# Patient Record
Sex: Female | Born: 1955
Health system: Southern US, Community
[De-identification: ages and names within clinical notes are randomized; demographics above are authoritative.]

## PROBLEM LIST (undated history)

## (undated) DIAGNOSIS — E559 Vitamin D deficiency, unspecified: Secondary | ICD-10-CM

## (undated) DIAGNOSIS — R140 Abdominal distension (gaseous): Secondary | ICD-10-CM

## (undated) DIAGNOSIS — F32A Depression, unspecified: Secondary | ICD-10-CM

## (undated) DIAGNOSIS — T7840XA Allergy, unspecified, initial encounter: Secondary | ICD-10-CM

## (undated) DIAGNOSIS — J189 Pneumonia, unspecified organism: Secondary | ICD-10-CM

## (undated) DIAGNOSIS — Z9889 Other specified postprocedural states: Secondary | ICD-10-CM

## (undated) DIAGNOSIS — R112 Nausea with vomiting, unspecified: Secondary | ICD-10-CM

## (undated) DIAGNOSIS — F419 Anxiety disorder, unspecified: Secondary | ICD-10-CM

## (undated) DIAGNOSIS — K589 Irritable bowel syndrome without diarrhea: Secondary | ICD-10-CM

## (undated) DIAGNOSIS — Z8711 Personal history of peptic ulcer disease: Secondary | ICD-10-CM

## (undated) DIAGNOSIS — I2510 Atherosclerotic heart disease of native coronary artery without angina pectoris: Secondary | ICD-10-CM

## (undated) DIAGNOSIS — I219 Acute myocardial infarction, unspecified: Secondary | ICD-10-CM

## (undated) DIAGNOSIS — M549 Dorsalgia, unspecified: Secondary | ICD-10-CM

## (undated) DIAGNOSIS — F329 Major depressive disorder, single episode, unspecified: Secondary | ICD-10-CM

## (undated) DIAGNOSIS — R141 Gas pain: Secondary | ICD-10-CM

## (undated) DIAGNOSIS — K219 Gastro-esophageal reflux disease without esophagitis: Secondary | ICD-10-CM

## (undated) DIAGNOSIS — K5790 Diverticulosis of intestine, part unspecified, without perforation or abscess without bleeding: Secondary | ICD-10-CM

## (undated) DIAGNOSIS — U071 COVID-19: Secondary | ICD-10-CM

## (undated) DIAGNOSIS — K5900 Constipation, unspecified: Secondary | ICD-10-CM

## (undated) HISTORY — DX: COVID-19: U07.1

## (undated) HISTORY — DX: Allergy, unspecified, initial encounter: T78.40XA

## (undated) HISTORY — DX: Diverticulosis of intestine, part unspecified, without perforation or abscess without bleeding: K57.90

## (undated) HISTORY — DX: Abdominal distension (gaseous): R14.0

## (undated) HISTORY — DX: Dorsalgia, unspecified: M54.9

## (undated) HISTORY — DX: Irritable bowel syndrome, unspecified: K58.9

## (undated) HISTORY — DX: Acute myocardial infarction, unspecified: I21.9

## (undated) HISTORY — DX: Vitamin D deficiency, unspecified: E55.9

## (undated) HISTORY — DX: Constipation, unspecified: K59.00

## (undated) HISTORY — DX: Personal history of peptic ulcer disease: Z87.11

## (undated) HISTORY — DX: Gas pain: R14.1

---

## 1982-09-07 HISTORY — PX: TUBAL LIGATION: SHX77

## 2009-09-07 DIAGNOSIS — J189 Pneumonia, unspecified organism: Secondary | ICD-10-CM

## 2009-09-07 DIAGNOSIS — I219 Acute myocardial infarction, unspecified: Secondary | ICD-10-CM

## 2009-09-07 HISTORY — DX: Acute myocardial infarction, unspecified: I21.9

## 2009-09-07 HISTORY — DX: Pneumonia, unspecified organism: J18.9

## 2009-09-07 HISTORY — PX: CORONARY ANGIOPLASTY WITH STENT PLACEMENT: SHX49

## 2014-05-21 ENCOUNTER — Ambulatory Visit: Payer: Self-pay | Admitting: Family Medicine

## 2014-05-23 ENCOUNTER — Ambulatory Visit (INDEPENDENT_AMBULATORY_CARE_PROVIDER_SITE_OTHER): Payer: Managed Care, Other (non HMO) | Admitting: Physician Assistant

## 2014-05-23 ENCOUNTER — Encounter: Payer: Self-pay | Admitting: Physician Assistant

## 2014-05-23 VITALS — BP 110/69 | HR 88 | Ht 68.0 in | Wt 248.0 lb

## 2014-05-23 DIAGNOSIS — K219 Gastro-esophageal reflux disease without esophagitis: Secondary | ICD-10-CM

## 2014-05-23 DIAGNOSIS — F32A Depression, unspecified: Secondary | ICD-10-CM | POA: Insufficient documentation

## 2014-05-23 DIAGNOSIS — I2583 Coronary atherosclerosis due to lipid rich plaque: Secondary | ICD-10-CM

## 2014-05-23 DIAGNOSIS — K5792 Diverticulitis of intestine, part unspecified, without perforation or abscess without bleeding: Secondary | ICD-10-CM | POA: Insufficient documentation

## 2014-05-23 DIAGNOSIS — Z8719 Personal history of other diseases of the digestive system: Secondary | ICD-10-CM

## 2014-05-23 DIAGNOSIS — I2510 Atherosclerotic heart disease of native coronary artery without angina pectoris: Secondary | ICD-10-CM

## 2014-05-23 DIAGNOSIS — I219 Acute myocardial infarction, unspecified: Secondary | ICD-10-CM

## 2014-05-23 DIAGNOSIS — K8020 Calculus of gallbladder without cholecystitis without obstruction: Secondary | ICD-10-CM

## 2014-05-23 DIAGNOSIS — R1011 Right upper quadrant pain: Secondary | ICD-10-CM

## 2014-05-23 DIAGNOSIS — F3289 Other specified depressive episodes: Secondary | ICD-10-CM

## 2014-05-23 DIAGNOSIS — F329 Major depressive disorder, single episode, unspecified: Secondary | ICD-10-CM

## 2014-05-23 DIAGNOSIS — I213 ST elevation (STEMI) myocardial infarction of unspecified site: Secondary | ICD-10-CM

## 2014-05-23 MED ORDER — SERTRALINE HCL 100 MG PO TABS: 100.0000 mg | ORAL_TABLET | Freq: Every day | ORAL | Status: DC

## 2014-05-23 MED ORDER — SUCRALFATE 1 G PO TABS: 1.0000 g | ORAL_TABLET | Freq: Three times a day (TID) | ORAL | Status: DC

## 2014-05-23 MED ORDER — DICYCLOMINE HCL 20 MG PO TABS: 20.0000 mg | ORAL_TABLET | Freq: Three times a day (TID) | ORAL | Status: DC

## 2014-05-23 MED ORDER — SIMVASTATIN 20 MG PO TABS: 20.0000 mg | ORAL_TABLET | Freq: Every day | ORAL | Status: DC

## 2014-05-23 MED ORDER — OMEPRAZOLE 10 MG PO CPDR: 30.0000 mg | DELAYED_RELEASE_CAPSULE | ORAL | Status: DC | PRN

## 2014-05-23 NOTE — Patient Instructions (Signed)
Will refer to [place] gastro[facility]ology.

## 2014-05-24 LAB — BASIC METABOLIC PANEL WITH GFR
BUN: 15 mg/dL (ref 6–23)
CO2: 25 meq/L (ref 19–32)
Calcium: 9.5 mg/dL (ref 8.4–10.5)
Chloride: 101 mEq/L (ref 96–112)
Creat: 0.64 mg/dL (ref 0.50–1.10)
GFR, Est African American: >89 mL/min
GFR, Est Non African American: >89 mL/min
Glucose, Bld: 85 mg/dL (ref 70–99)
Potassium: 4.4 mEq/L (ref 3.5–5.3)
Sodium: 137 mEq/L (ref 135–145)

## 2014-05-24 LAB — CBC WITH DIFFERENTIAL/PLATELET
BASOS ABS: 0 10*3/uL (ref 0.0–0.1)
Basophils Relative: 0 % (ref 0–1)
EOS ABS: 0.1 10*3/uL (ref 0.0–0.7)
EOS PCT: 1 % (ref 0–5)
HCT: 41.3 % (ref 36.0–46.0)
Hemoglobin: 13.6 g/dL (ref 12.0–15.0)
LYMPHS PCT: 32 % (ref 12–46)
Lymphs Abs: 4.6 10*3/uL — ABNORMAL HIGH (ref 0.7–4.0)
MCH: 29.1 pg (ref 26.0–34.0)
MCHC: 32.9 g/dL (ref 30.0–36.0)
MCV: 88.2 fL (ref 78.0–100.0)
Monocytes Absolute: 1 10*3/uL (ref 0.1–1.0)
Monocytes Relative: 7 % (ref 3–12)
NEUTROS PCT: 60 % (ref 43–77)
Neutro Abs: 8.6 10*3/uL — ABNORMAL HIGH (ref 1.7–7.7)
Platelets: 245 10*3/uL (ref 150–400)
RBC: 4.68 MIL/uL (ref 3.87–5.11)
RDW: 14.1 % (ref 11.5–15.5)
WBC: 14.4 10*3/uL — ABNORMAL HIGH (ref 4.0–10.5)

## 2014-05-24 LAB — H. PYLORI ANTIBODY, IGG: H PYLORI IGG: 0.65 {ISR}

## 2014-05-25 DIAGNOSIS — K8020 Calculus of gallbladder without cholecystitis without obstruction: Secondary | ICD-10-CM | POA: Insufficient documentation

## 2014-05-25 DIAGNOSIS — Z8719 Personal history of other diseases of the digestive system: Secondary | ICD-10-CM | POA: Insufficient documentation

## 2014-05-25 NOTE — Progress Notes (Signed)
Subjective:    Patient ID: Nina Taylor, female    DOB: 1956/06/29, 58 y.o.   MRN: [id number]  HPI Pt is a 58 yo women who presents to the clinic to establish care.   .. Active Ambulatory Problems    Diagnosis Date Noted  . GERD (gastroesophageal reflux disease) 05/23/2014  . Heart attack 05/23/2014  . CAD (coronary artery disease) 05/23/2014  . Depression 05/23/2014  . Diverticulitis 05/23/2014  . History of diverticulitis 05/25/2014   Resolved Ambulatory Problems    Diagnosis Date Noted  . No Resolved Ambulatory Problems   No Additional Past Medical History   .Marland KitchenHistory reviewed. No pertinent family history. .. History   Social History  . Marital Status: Married    Spouse Name: N/A    Number of Children: N/A  . Years of Education: N/A   Occupational History  . Not on file.   Social History Main Topics  . Smoking status: Current Every Day Smoker  . Smokeless tobacco: Not on file  . Alcohol Use: Yes  . Drug Use: No  . Sexual Activity: Not Currently   Other Topics Concern  . Not on file   Social History Narrative  . No narrative on file    She is also here to follow up after ER visit on 05/15/2014 RUQ pain and bright red blood in stool. Blood in stool has resolved. She has had RUQ pain that she has related to her gallbladder for years. Pain has been off and on. She started some natural supplements once before and helped make pain better. For the last week pain has been much worse and then she noted bright red blood in stool. Denies hard stools or constipation. Her RUQ pain is constant and worse with movement and bending. She is nauseated but no vomiting. She does have GERD and taking prilosec. Certain foods do make burning worse in stomach such as fatty greasy foods. Since her first episode of RUQ pain years ago she has changed her diet to baked foods and limited spicies. Never had colonoscopy. Never been to GI. At ER CT, labs, ultrasound done. Did find a single stone  in gallbladder with no indication of obstruction. She was referred to [facility] specialist but she would like to stay in [place].    Review of Systems  All other systems reviewed and are negative.      Objective:   Physical Exam  Constitutional: She is oriented to person, place, and time. She appears well-developed and well-nourished.  Obese.   HENT:  Head: Normocephalic and atraumatic.  Eyes: Conjunctivae are normal.  Neck: Normal range of motion. Neck supple.  Cardiovascular: Normal rate, regular rhythm and normal heart sounds.   Pulmonary/Chest: Effort normal. She has no wheezes.  Abdominal: Soft. She exhibits no mass.  Positive murphys sign.  Tenderness to palpation over RUQ and down over RLQ.   No pain of LUQ and LLQ.   slighty hypoactive bowel sounds.   Lymphadenopathy:    She has no cervical adenopathy.  Neurological: She is alert and oriented to person, place, and time.  Skin: Skin is dry.  Psychiatric: She has a normal mood and affect. Her behavior is normal.          Assessment & Plan:  RUQ pain/Hx of diverticulitis/GERD- pt has had work up including abdominal u/s and CT of abdomen for acute cholecystitis and diverticulitis and was negative. Could still be malfunctioning gallbladder, perhaps needs HIDA scan. Will refer to GI and general  surgery for evaluation. Symptoms also could be GERD exacerbation or even IBS symptoms. Gave pt carafate for next couple of days to add to meal and daily omemprazole to see if gets any relief. Will order h.pylori. Also sent bentyl for stomach cramping to try. Pt has not had colonoscopy either needs to have one scheduled.   Depression- well controlled refilled for 1 year.   CAD/hyperlipidemia/hx of MI- need to recheck. Refilled for a couple of months. Need CPE and will recheck fasting labs.

## 2014-05-29 ENCOUNTER — Telehealth: Payer: Self-pay | Admitting: *Deleted

## 2014-06-22 ENCOUNTER — Other Ambulatory Visit (INDEPENDENT_AMBULATORY_CARE_PROVIDER_SITE_OTHER): Payer: Self-pay | Admitting: Surgery

## 2014-06-25 ENCOUNTER — Encounter: Payer: Self-pay | Admitting: Physician Assistant

## 2014-08-07 ENCOUNTER — Ambulatory Visit (HOSPITAL_COMMUNITY)
Admission: RE | Admit: 2014-08-07 | Discharge: 2014-08-07 | Disposition: A | Discharge status: Home / Self Care | Payer: Managed Care, Other (non HMO) | Source: Ambulatory Visit | Attending: Anesthesiology | Admitting: Anesthesiology

## 2014-08-07 ENCOUNTER — Encounter (HOSPITAL_COMMUNITY)
Admission: RE | Admit: 2014-08-07 | Discharge: 2014-08-07 | Disposition: A | Discharge status: Home / Self Care | Payer: Managed Care, Other (non HMO) | Source: Ambulatory Visit | Attending: Surgery | Admitting: Surgery

## 2014-08-07 ENCOUNTER — Encounter (HOSPITAL_COMMUNITY): Payer: Self-pay

## 2014-08-07 DIAGNOSIS — Z01818 Encounter for other preprocedural examination: Secondary | ICD-10-CM | POA: Diagnosis not present

## 2014-08-07 DIAGNOSIS — K8020 Calculus of gallbladder without cholecystitis without obstruction: Secondary | ICD-10-CM | POA: Insufficient documentation

## 2014-08-07 HISTORY — DX: Atherosclerotic heart disease of native coronary artery without angina pectoris: I25.10

## 2014-08-07 HISTORY — DX: Pneumonia, unspecified organism: J18.9

## 2014-08-07 HISTORY — DX: Nausea with vomiting, unspecified: R11.2

## 2014-08-07 HISTORY — DX: Depression, unspecified: F32.A

## 2014-08-07 HISTORY — DX: Anxiety disorder, unspecified: F41.9

## 2014-08-07 HISTORY — DX: Major depressive disorder, single episode, unspecified: F32.9

## 2014-08-07 HISTORY — DX: Gastro-esophageal reflux disease without esophagitis: K21.9

## 2014-08-07 HISTORY — DX: Other specified postprocedural states: Z98.890

## 2014-08-07 LAB — CBC
HEMATOCRIT: 45.1 % (ref 36.0–46.0)
Hemoglobin: 14.2 g/dL (ref 12.0–15.0)
MCH: 28.7 pg (ref 26.0–34.0)
MCHC: 31.5 g/dL (ref 30.0–36.0)
MCV: 91.1 fL (ref 78.0–100.0)
Platelets: 230 10*3/uL (ref 150–400)
RBC: 4.95 MIL/uL (ref 3.87–5.11)
RDW: 14.7 % (ref 11.5–15.5)
WBC: 13.1 10*3/uL — ABNORMAL HIGH (ref 4.0–10.5)

## 2014-08-07 LAB — BASIC METABOLIC PANEL
Anion gap: 14 (ref 5–15)
BUN: 10 mg/dL (ref 6–23)
CHLORIDE: 101 meq/L (ref 96–112)
CO2: 23 meq/L (ref 19–32)
CREATININE: 0.51 mg/dL (ref 0.50–1.10)
Calcium: 9.3 mg/dL (ref 8.4–10.5)
GFR calc Af Amer: >90 mL/min (ref >90)
GFR calc non Af Amer: >90 mL/min (ref >90)
Glucose, Bld: 105 mg/dL — ABNORMAL HIGH (ref 70–99)
POTASSIUM: 4.9 meq/L (ref 3.7–5.3)
Sodium: 138 mEq/L (ref 137–147)

## 2014-08-07 NOTE — Pre-Procedure Instructions (Signed)
Nina Taylor  08/07/2014   Your procedure is scheduled on:  Wednesday December 9 ,2015 at 0830 AM  Report to [place] at 0630 AM.  Call this number if you have problems the morning of surgery: [phone]   Remember:   Do not eat food or drink liquids after midnight.   Take these medicines the morning of surgery with A SIP OF WATER: Prilosec and Zoloft Stop Aspirin ,Nsaids, and herbal medications 5 days prior to surgery.    Do not wear jewelry, make-up or nail polish.  Do not wear lotions, powders, or perfumes. You may wear deodorant.  Do not shave 48 hours prior to surgery.   Do not bring valuables to the hospital.  [facility] is not responsible                  for any belongings or valuables.               Contacts, dentures or bridgework may not be worn into surgery.  Leave suitcase in the car. After surgery it may be brought to your room.  For patients admitted to the hospital, discharge time is determined by your                treatment team.               Patients discharged the day of surgery will not be allowed to drive  home.    Special Instructions: [facility] - Preparing for Surgery  Before surgery, you can play an important role.  Because skin is not sterile, your skin needs to be as free of germs as possible.  You can reduce the number of germs on you skin by washing with CHG (chlorahexidine gluconate) soap before surgery.  CHG is an antiseptic cleaner which kills germs and bonds with the skin to continue killing germs even after washing.  Please DO NOT use if you have an allergy to CHG or antibacterial soaps.  If your skin becomes reddened/irritated stop using the CHG and inform your nurse when you arrive at Short Stay.  Do not shave (including legs and underarms) for at least 48 hours prior to the first CHG shower.  You may shave your face.  Please follow these instructions carefully:   1.  Shower with CHG Soap the night before surgery  and the                                morning of Surgery.  2.  If you choose to wash your hair, wash your hair first as usual with your       normal shampoo.  3.  After you shampoo, rinse your hair and body thoroughly to remove the                      Shampoo.  4.  Use CHG as you would any other liquid soap.  You can apply chg directly       to the skin and wash gently with scrungie or a clean washcloth.  5.  Apply the CHG Soap to your body ONLY FROM THE NECK DOWN.        Do not use on open wounds or open sores.  Avoid contact with your eyes,       ears, mouth and genitals (private parts).  Wash genitals (private parts)  with your normal soap.  6.  Wash thoroughly, paying special attention to the area where your surgery        will be performed.  7.  Thoroughly rinse your body with warm water from the neck down.  8.  DO NOT shower/wash with your normal soap after using and rinsing off       the CHG Soap.  9.  Pat yourself dry with a clean towel.            10.  Wear clean pajamas.            11.  Place clean sheets on your bed the night of your first shower and do not        sleep with pets.  Day of Surgery  Do not apply any lotions/deoderants the morning of surgery.  Please wear clean clothes to the hospital/surgery center.      Please read over the following fact sheets that you were given: Pain Booklet, Coughing and Deep Breathing and Surgical Site Infection Prevention

## 2014-08-09 NOTE — Progress Notes (Addendum)
Anesthesia Chart Review:  Pt is 58 year old female scheduled for laparoscopic cholecystectomy with intraoperative cholangiogram on 08/15/2014 with Dr. Ninfa Linden.   PMH: CAD (MI 2011, s/p cardiac cath), GERD, anxiety, depression. Current smoker. BMI 38  Medications include: ASA, simvastatin, prilosec, zoloft.   Preoperative labs reviewed.  WBC 13.1  EKG: NSR.   Cardiac cath 07/18/2010: R dominant. Estimated EF 65%.  Mid CX tubular 95% and discrete 75%; stent to 0%.  S/p aspiration thrombectomy mid CX  Spoke with pt by telephone. Last cardiology visit was a year ago with [facility]. Was told did not need to return to cardiology for care unless had future needs. Pt reports walking dogs for 30 minutes twice daily, easily walking up stairs in her home and walking a lot for her job in [employer].   Attempting to get last cardiology note.   Willeen Cass, FNP-BC [facility] Short Stay Surgical Center/Anesthesiology Phone: [phone] 08/09/2014 5:11 PM  Addendum:   Unable to get records from pt's cardiologist Dr. Modesta Messing in [place]. Discussed with Dr. Marcie Bal. Pt will need cardiac clearance prior to surgery. Notified triage nurse at Dr. Trevor Mace office.   Willeen Cass, FNP-BC [facility] Short Stay Surgical Center/Anesthesiology Phone: [phone] 08/10/2014 1:45 PM  Addendum: Patient was seen by cardiologist Dr. Bronson Ing on 08/13/14.  His note states, "I will obtain an echocardiogram on 08/14/14 to obtain a baseline assessment of LV systolic function and regional wall motion. She has no murmurs to suggest valvular pathology. She appears to be at a low risk for a major adverse perioperative cardiac event. I strongly encouraged tobacco cessation.  Echo was done today (08/14/14) and showed: - Left ventricle: The cavity size was normal. Wall thickness was increased in a pattern of moderate LVH. Systolic function was normal. The estimated ejection  fraction was in the range of 60% to 65%. Wall motion was normal; there were no regional wall motion abnormalities. Left ventricular diastolic function parameters were normal. - Aortic valve: Mildly calcified annulus. Mildly thickened leaflets. - Technically adequate study.  Unless otherwise stated, I anticipate that she can proceed as planned since Dr. Court Joy note states he felt she was low risk for surgery but wanted to confirm that she had normal LVEF and wall motion.  Echo today confirmed this. There was also no significant valvular abnormalities.   George Hugh [facility] Short Stay Center/Anesthesiology Phone [phone] 08/14/2014 3:44 PM

## 2014-08-10 ENCOUNTER — Telehealth (INDEPENDENT_AMBULATORY_CARE_PROVIDER_SITE_OTHER): Payer: Self-pay

## 2014-08-10 NOTE — Telephone Encounter (Signed)
Received call from Levada Dy at [facility] short stay. Pt scheduled for GB surgery 08-15-14 but will need cardiac clearance. I advised her this request will be sent to Dr Ninfa Linden and Corene Cornea. Will also send msg in [employer].

## 2014-08-13 ENCOUNTER — Encounter: Payer: Self-pay | Admitting: Cardiovascular Disease

## 2014-08-13 ENCOUNTER — Ambulatory Visit (INDEPENDENT_AMBULATORY_CARE_PROVIDER_SITE_OTHER): Payer: Managed Care, Other (non HMO) | Admitting: Cardiovascular Disease

## 2014-08-13 ENCOUNTER — Ambulatory Visit: Payer: Managed Care, Other (non HMO) | Admitting: Cardiology

## 2014-08-13 ENCOUNTER — Encounter: Payer: Self-pay | Admitting: *Deleted

## 2014-08-13 VITALS — BP 101/66 | HR 95 | Ht 68.0 in | Wt 250.0 lb

## 2014-08-13 DIAGNOSIS — I2510 Atherosclerotic heart disease of native coronary artery without angina pectoris: Secondary | ICD-10-CM

## 2014-08-13 DIAGNOSIS — I252 Old myocardial infarction: Secondary | ICD-10-CM

## 2014-08-13 DIAGNOSIS — Z955 Presence of coronary angioplasty implant and graft: Secondary | ICD-10-CM

## 2014-08-13 DIAGNOSIS — Z01818 Encounter for other preprocedural examination: Secondary | ICD-10-CM

## 2014-08-13 DIAGNOSIS — Z716 Tobacco abuse counseling: Secondary | ICD-10-CM

## 2014-08-13 NOTE — Progress Notes (Addendum)
Patient ID: Nina Taylor, female   DOB: October 01, 1955, 58 y.o.   MRN: [id number]       CARDIOLOGY CONSULT NOTE  Patient ID: Nina Taylor MRN: [id number] DOB/AGE: 1956/04/30 58 y.o.  Admit date: (Not on file) Primary Physician Iran Planas, PA-C  Reason for Consultation: preoperative risk stratification, CAD with h/o MI and stent  HPI: The patient is a 58 year old female scheduled for laparoscopic cholecystectomy with intraoperative cholangiogram on 08/15/2014 with Dr. Ninfa Linden for symptomatic choledocholithiasis.  She reporteldy has a h/o MI and left circumflex coronary artery stent placement in 07/2010, as per a prior office visit. I have no records available at the present time regarding specific past cardiovascular testing. ECG on 08/07/14 showed normal sinus rhythm with no ischemic ST-T abnormalities. CBC showed WBC 13.1. BMET was normal. She underwent stenting in [place] with a cardiologist from [facility]. She participated in cardiac rehab for 3 months and was on dual antiplatelet therapy for 1 year. She last saw her cardiologist there approximately 3 years ago. She denies chest pain, palpitations, shortness of breath, leg swelling, orthopnea, and paroxysmal nocturnal dyspnea. She had been exercising daily at the [facility] in [place] prior to experiencing nausea and abdominal pain from gallstones. She smokes approximately 1 pack of cigarettes per week, and had smoked 1 ppd. She had quit for 6 months in the past and then restarted.  Soc: Married. Son in [place]. Originally from [place], moved to [place] as a child. Moved to [place] in past decade and recently moved to [place] from [place]. 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   Allergies  Allergen Reactions  . Vicodin [Hydrocodone-Acetaminophen] Nausea And Vomiting    Current Outpatient Prescriptions  Medication Sig Dispense Refill  . aspirin 81 MG EC tablet Take 81 mg by mouth.    .  cyclobenzaprine (FLEXERIL) 10 MG tablet Take 10 mg by mouth every 8 (eight) hours as needed for muscle spasms.     Marland Kitchen dicyclomine (BENTYL) 20 MG tablet Take 1 tablet (20 mg total) by mouth 3 (three) times daily before meals. (Patient not taking: Reported on 08/06/2014) 90 tablet 0  . diphenhydrAMINE (BENADRYL) 25 MG tablet Take 25-50 mg by mouth every 6 (six) hours as needed for sleep.    Marland Kitchen omeprazole (PRILOSEC) 10 MG capsule Take 3 capsules (30 mg total) by mouth as needed. (Patient taking differently: Take 30 mg by mouth as needed (reflux). ) 270 capsule 4  . sertraline (ZOLOFT) 100 MG tablet Take 1 tablet (100 mg total) by mouth daily. 90 tablet 4  . simvastatin (ZOCOR) 20 MG tablet Take 1 tablet (20 mg total) by mouth daily. 90 tablet 0  . sucralfate (CARAFATE) 1 G tablet Take 1 tablet (1 g total) by mouth 4 (four) times daily -  with meals and at bedtime. (Patient not taking: Reported on 08/06/2014) 90 tablet 0   No current facility-administered medications for this visit.    Past Medical History  Diagnosis Date  . PONV (postoperative nausea and vomiting)   . Heart attack 2011    in [place], [place] with [facility]  . Coronary artery disease   . Pneumonia 2011  . Depression   . Anxiety   . GERD (gastroesophageal reflux disease)     Past Surgical History  Procedure Laterality Date  . Coronary angioplasty with stent placement  2011  . Tubal ligation  1984    History   Social History  . Marital Status: Married  Spouse Name: N/A    Number of Children: N/A  . Years of Education: N/A   Occupational History  . Not on file.   Social History Main Topics  . Smoking status: Current Every Day Smoker -- 1.00 packs/day for 20 years    Types: Cigarettes  . Smokeless tobacco: Never Used  . Alcohol Use: Yes     Comment: wine rarely  . Drug Use: No  . Sexual Activity: Not Currently   Other Topics Concern  . Not on file   Social History Narrative     No family history of  premature CAD in 1st degree relatives.  Prior to Admission medications   Medication Sig Start Date End Date Taking? Authorizing Provider  aspirin 81 MG EC tablet Take 81 mg by mouth.    Historical Provider, MD  cyclobenzaprine (FLEXERIL) 10 MG tablet Take 10 mg by mouth every 8 (eight) hours as needed for muscle spasms.  02/19/14 02/19/15  Historical Provider, MD  dicyclomine (BENTYL) 20 MG tablet Take 1 tablet (20 mg total) by mouth 3 (three) times daily before meals. Patient not taking: Reported on 08/06/2014 05/23/14   Donella Stade, PA-C  diphenhydrAMINE (BENADRYL) 25 MG tablet Take 25-50 mg by mouth every 6 (six) hours as needed for sleep.    Historical Provider, MD  omeprazole (PRILOSEC) 10 MG capsule Take 3 capsules (30 mg total) by mouth as needed. Patient taking differently: Take 30 mg by mouth as needed (reflux).  05/23/14   Jade L Breeback, PA-C  sertraline (ZOLOFT) 100 MG tablet Take 1 tablet (100 mg total) by mouth daily. 05/23/14   Jade L Breeback, PA-C  simvastatin (ZOCOR) 20 MG tablet Take 1 tablet (20 mg total) by mouth daily. 05/23/14   Jade L Breeback, PA-C  sucralfate (CARAFATE) 1 G tablet Take 1 tablet (1 g total) by mouth 4 (four) times daily -  with meals and at bedtime. Patient not taking: Reported on 08/06/2014 05/23/14   Donella Stade, PA-C     Review of systems complete and found to be negative unless listed above in HPI     Physical exam  BP 101/66  Pulse 95  SpO2 98%   General: NAD Neck: No JVD, no thyromegaly or thyroid nodule.  Lungs: Clear to auscultation bilaterally with normal respiratory effort. CV: Nondisplaced PMI. Regular rate and rhythm, normal S1/S2, no S3/S4, no murmur.  No peripheral edema.  No carotid bruit.  Normal pedal pulses.  Abdomen: Soft, obese, no distention.  Skin: Intact without lesions or rashes.  Neurologic: Alert and oriented x 3.  Psych: Normal affect. Extremities: No clubbing or cyanosis.  HEENT: Normal.   ECG: Most recent  ECG reviewed.  Labs:   Lab Results  Component Value Date   WBC 13.1* 08/07/2014   HGB 14.2 08/07/2014   HCT 45.1 08/07/2014   MCV 91.1 08/07/2014   PLT 230 08/07/2014    Recent Labs Lab 08/07/14 1050  NA 138  K 4.9  CL 101  CO2 23  BUN 10  CREATININE 0.51  CALCIUM 9.3  GLUCOSE 105*   No results found for: CKTOTAL, CKMB, CKMBINDEX, TROPONINI No results found for: CHOL No results found for: HDL No results found for: LDLCALC No results found for: TRIG No results found for: CHOLHDL No results found for: LDLDIRECT       Studies: No results found.  ASSESSMENT AND PLAN:  1. Preoperative risk stratification: She denies exertional symptoms. ECG is normal. She had been  on ASA prior to surgical planning. I would recommend she begin this as soon as feasible in the post-operative period. Continue statin therapy. I will obtain an echocardiogram on 08/14/14 to obtain a baseline assessment of LV systolic function and regional wall motion. She has no murmurs to suggest valvular pathology. She appears to be at a low risk for a major adverse perioperative cardiac event. I strongly encouraged tobacco cessation. 2. CAD with h/o NSTEMI and two LCx stents: Symptomatically stable. She had been on ASA prior to surgical planning. I would recommend she begin this as soon as feasible in the post-operative period. Continue statin therapy. I will obtain an echocardiogram on 08/14/14 to obtain a baseline assessment of LV systolic function and regional wall motion. She has no murmurs to suggest valvular pathology. Would consider beta blockade in the future given her history of MI. Will try and obtain records from [facility]. 3. Tobacco abuse: Cessation counseling provided.  Dispo: f/u 6 months.  Signed: Kate Sable, M.D., F.A.C.C.  08/13/2014, 3:42 PM

## 2014-08-13 NOTE — Patient Instructions (Signed)
Your physician has requested that you have an echocardiogram. Echocardiography is a painless test that uses sound waves to create images of your heart. It provides your doctor with information about the size and shape of your heart and how well your heart's chambers and valves are working. This procedure takes approximately one hour. There are no restrictions for this procedure. Office will contact with results via phone or letter.   Resume Aspirin after surgery & surgeon feels you safely can do so. Continue all other medications.   Your physician wants you to follow up in: 6 months.  You will receive a reminder letter in the mail one-two months in advance.  If you don't receive a letter, please call our office to schedule the follow up appointment

## 2014-08-14 ENCOUNTER — Ambulatory Visit (HOSPITAL_COMMUNITY)
Admission: RE | Admit: 2014-08-14 | Discharge: 2014-08-14 | Disposition: A | Discharge status: Home / Self Care | Payer: Managed Care, Other (non HMO) | Source: Ambulatory Visit | Attending: Cardiology | Admitting: Cardiology

## 2014-08-14 ENCOUNTER — Telehealth: Payer: Self-pay | Admitting: Cardiovascular Disease

## 2014-08-14 DIAGNOSIS — I252 Old myocardial infarction: Secondary | ICD-10-CM

## 2014-08-14 DIAGNOSIS — F17210 Nicotine dependence, cigarettes, uncomplicated: Secondary | ICD-10-CM | POA: Insufficient documentation

## 2014-08-14 DIAGNOSIS — I361 Nonrheumatic tricuspid (valve) insufficiency: Secondary | ICD-10-CM | POA: Diagnosis not present

## 2014-08-14 DIAGNOSIS — Z01818 Encounter for other preprocedural examination: Secondary | ICD-10-CM

## 2014-08-14 DIAGNOSIS — I517 Cardiomegaly: Secondary | ICD-10-CM

## 2014-08-14 DIAGNOSIS — I2510 Atherosclerotic heart disease of native coronary artery without angina pectoris: Secondary | ICD-10-CM | POA: Diagnosis present

## 2014-08-14 MED ORDER — CEFAZOLIN SODIUM-DEXTROSE 2-3 GM-% IV SOLR
2.0000 g | INTRAVENOUS | Status: AC
  Administered 2014-08-15: 2 g via INTRAVENOUS
  Filled 2014-08-14: qty 50

## 2014-08-14 NOTE — Telephone Encounter (Signed)
No precert required 

## 2014-08-14 NOTE — H&P (Signed)
Nina Taylor  Location: [facility] Patient #: [id number] DOB: [birth year] Married / Language: Undefined / Race: Undefined Female  History of Present Illness ( Patient words: eval abdominal pain, gallstones.  The patient is a 58 year old female who presents with symptomatic choledocholithiasis. This is a very pleasant female referred by Iran Planas, PA for evaluation of right quadrant abdominal pain and nausea. She has had several weeks of daily sharp, intermittent right upper quadrant abdominal pain with bloating and mild nausea. He does seem to after fatty meals. She has no emesis. She denies jaundice. The pain is moderate in intensity. It does not refer any where else. She is otherwise without complaints   Other Problems ( Arthritis Cholelithiasis Depression Gastroesophageal Reflux Disease Migraine Headache Myocardial infarction Other disease, cancer, significant illness  Past Surgical History  Cesarean Section - Multiple Oral Surgery  Diagnostic Studies History Colonoscopy never Mammogram 1-3 years ago Pap Smear 1-5 years ago  Allergies ( Vicodin *ANALGESICS - OPIOID*  Medication History ( Aspirin (81MG  Tablet, Oral) Active. Omeprazole (10MG  Capsule DR, Oral) Active. Sertraline HCl (100MG  Tablet, Oral) Active. Simvastatin (20MG  Tablet, Oral) Active.  Social History ( Alcohol use Occasional alcohol use. Caffeine use Carbonated beverages, Coffee. No drug use Tobacco use Current some day smoker.  Family History ( Heart disease in female family member before age 25 Heart disease in female family member before age 24 Prostate Cancer Father.  Pregnancy / Birth History [place], [place];  Age at menarche 75 years. Age of menopause 72-55 Gravida 4 Maternal age 19-25 Para 2  Review of Systems (Lindsey. Brooks CMA;  General Present- Fatigue and Weight Gain. Not Present- Appetite Loss, Chills, Fever, Night Sweats  and Weight Loss. Skin Not Present- Change in Wart/Mole, Dryness, Hives, Jaundice, New Lesions, Non-Healing Wounds, Rash and Ulcer. HEENT Present- Earache, Seasonal Allergies and Wears glasses/contact lenses. Not Present- Hearing Loss, Hoarseness, Nose Bleed, Oral Ulcers, Ringing in the Ears, Sinus Pain, Sore Throat, Visual Disturbances and Yellow Eyes. Respiratory Present- Snoring. Not Present- Bloody sputum, Chronic Cough, Difficulty Breathing and Wheezing. Cardiovascular Not Present- Chest Pain, Difficulty Breathing Lying Down, Leg Cramps, Palpitations, Rapid Heart Rate, Shortness of Breath and Swelling of Extremities. Gastrointestinal Present- Abdominal Pain, Bloating, Change in Bowel Habits, Excessive gas, Hemorrhoids and Nausea. Not Present- Bloody Stool, Chronic diarrhea, Constipation, Difficulty Swallowing, Gets full quickly at meals, Indigestion, Rectal Pain and Vomiting. Female Genitourinary Present- Nocturia. Not Present- Frequency, Painful Urination, Pelvic Pain and Urgency. Musculoskeletal Not Present- Back Pain, Joint Pain, Joint Stiffness, Muscle Pain, Muscle Weakness and Swelling of Extremities. Neurological Present- Headaches. Not Present- Decreased Memory, Fainting, Numbness, Seizures, Tingling, Tremor, Trouble walking and Weakness. Psychiatric Present- Depression. Not Present- Anxiety, Bipolar, Change in Sleep Pattern, Fearful and Frequent crying. Endocrine Not Present- Cold Intolerance, Excessive Hunger, Hair Changes, Heat Intolerance, Hot flashes and New Diabetes. Hematology Present- Persistent Infections. Not Present- Easy Bruising, Excessive bleeding, Gland problems and HIV.   Vitals ( 06/22/2014 9:05 AM Weight: 249.38 lb Height: 68in Body Surface Area: 2.33 m Body Mass Index: 37.92 kg/m Pulse: 76 (Regular)  Resp.: 16 (Unlabored)  BP: 116/74 (Sitting, Left Arm, Standard)    Physical Exam General Mental Status-Alert. General Appearance-Consistent with  stated age. Hydration-Well hydrated. Voice-Normal.  Head and Neck Head-normocephalic, atraumatic with no lesions or palpable masses.  Eye Eyeball - Bilateral-Extraocular movements intact. Sclera/Conjunctiva - Bilateral-No scleral icterus.  Chest and Lung Exam Chest and lung exam reveals -quiet, even and easy respiratory effort with no use  of accessory muscles and on auscultation, normal breath sounds, no adventitious sounds and normal vocal resonance. Inspection Chest Wall - Normal. Back - normal.  Cardiovascular Cardiovascular examination reveals -on palpation PMI is normal in location and amplitude, no palpable S3 or S4. Normal cardiac borders., normal heart sounds, regular rate and rhythm with no murmurs, carotid auscultation reveals no bruits and normal pedal pulses bilaterally.  Abdomen Inspection Inspection of the abdomen reveals - No Hernias. Skin - Scar - no surgical scars. Palpation/Percussion Palpation and Percussion of the abdomen reveal - Soft, No Rebound tenderness, No Rigidity (guarding) and No hepatosplenomegaly. Tenderness - Right Upper Quadrant. Auscultation Auscultation of the abdomen reveals - Bowel sounds normal.  Neurologic Neurologic evaluation reveals -alert and oriented x 3 with no impairment of recent or remote memory. Mental Status-Normal.  Musculoskeletal Normal Exam - Left-Upper Extremity Strength Normal and Lower Extremity Strength Normal. Normal Exam - Right-Upper Extremity Strength Normal, Lower Extremity Weakness.    Assessment & Plan  SYMPTOMATIC CHOLELITHIASIS (574.20  K80.20) Impression: Patient presents with symptomatic cholelithiasis. Laparoscopic cholecystectomy is recommended. I gave her literature regarding this. We discussed the procedure in detail. I discussed the risks which include but not limited to bleeding, infection, bile duct injury, bile leak, need to convert to an open procedure, etc. I also discussed  postoperative recovery. She agrees to proceed.

## 2014-08-14 NOTE — Progress Notes (Signed)
  Echocardiogram 2D Echocardiogram has been performed.  Bryant, [place] 08/14/2014, 11:42 AM

## 2014-08-14 NOTE — Telephone Encounter (Signed)
Echo schedule at [facility] 12/8 @ 10:30

## 2014-08-15 ENCOUNTER — Ambulatory Visit (HOSPITAL_COMMUNITY): Payer: Managed Care, Other (non HMO) | Admitting: Certified Registered Nurse Anesthetist

## 2014-08-15 ENCOUNTER — Encounter (HOSPITAL_COMMUNITY)
Admission: RE | Disposition: A | Discharge status: Home / Self Care | Payer: Self-pay | Source: Ambulatory Visit | Attending: Surgery

## 2014-08-15 ENCOUNTER — Ambulatory Visit (HOSPITAL_COMMUNITY): Payer: Managed Care, Other (non HMO) | Admitting: Emergency Medicine

## 2014-08-15 ENCOUNTER — Encounter (HOSPITAL_COMMUNITY): Payer: Self-pay | Admitting: *Deleted

## 2014-08-15 ENCOUNTER — Ambulatory Visit (HOSPITAL_COMMUNITY)
Admission: RE | Admit: 2014-08-15 | Discharge: 2014-08-15 | Disposition: A | Discharge status: Home / Self Care | Payer: Managed Care, Other (non HMO) | Source: Ambulatory Visit | Attending: Surgery | Admitting: Surgery

## 2014-08-15 DIAGNOSIS — Z7982 Long term (current) use of aspirin: Secondary | ICD-10-CM | POA: Diagnosis not present

## 2014-08-15 DIAGNOSIS — F17200 Nicotine dependence, unspecified, uncomplicated: Secondary | ICD-10-CM | POA: Diagnosis not present

## 2014-08-15 DIAGNOSIS — K8020 Calculus of gallbladder without cholecystitis without obstruction: Secondary | ICD-10-CM | POA: Diagnosis present

## 2014-08-15 DIAGNOSIS — M1990 Unspecified osteoarthritis, unspecified site: Secondary | ICD-10-CM | POA: Insufficient documentation

## 2014-08-15 DIAGNOSIS — G43909 Migraine, unspecified, not intractable, without status migrainosus: Secondary | ICD-10-CM | POA: Insufficient documentation

## 2014-08-15 DIAGNOSIS — Z6838 Body mass index (BMI) 38.0-38.9, adult: Secondary | ICD-10-CM | POA: Diagnosis not present

## 2014-08-15 DIAGNOSIS — I252 Old myocardial infarction: Secondary | ICD-10-CM | POA: Diagnosis not present

## 2014-08-15 DIAGNOSIS — K8010 Calculus of gallbladder with chronic cholecystitis without obstruction: Secondary | ICD-10-CM | POA: Insufficient documentation

## 2014-08-15 DIAGNOSIS — I2510 Atherosclerotic heart disease of native coronary artery without angina pectoris: Secondary | ICD-10-CM | POA: Insufficient documentation

## 2014-08-15 DIAGNOSIS — Z859 Personal history of malignant neoplasm, unspecified: Secondary | ICD-10-CM | POA: Diagnosis not present

## 2014-08-15 DIAGNOSIS — F329 Major depressive disorder, single episode, unspecified: Secondary | ICD-10-CM | POA: Diagnosis not present

## 2014-08-15 DIAGNOSIS — K219 Gastro-esophageal reflux disease without esophagitis: Secondary | ICD-10-CM | POA: Diagnosis not present

## 2014-08-15 HISTORY — PX: CHOLECYSTECTOMY, LAPAROSCOPIC: SHX56

## 2014-08-15 HISTORY — PX: CHOLECYSTECTOMY: SHX55

## 2014-08-15 SURGERY — LAPAROSCOPIC CHOLECYSTECTOMY WITH INTRAOPERATIVE CHOLANGIOGRAM
Anesthesia: General | Site: Abdomen

## 2014-08-15 MED ORDER — ONDANSETRON HCL 4 MG/2ML IJ SOLN
INTRAMUSCULAR | Status: AC
  Filled 2014-08-15: qty 2

## 2014-08-15 MED ORDER — TRAMADOL HCL 50 MG PO TABS: 50.0000 mg | ORAL_TABLET | Freq: Four times a day (QID) | ORAL | Status: DC | PRN

## 2014-08-15 MED ORDER — SODIUM CHLORIDE 0.9 % IR SOLN
Status: DC | PRN
  Administered 2014-08-15: 1000 mL

## 2014-08-15 MED ORDER — PHENYLEPHRINE HCL 10 MG/ML IJ SOLN
INTRAMUSCULAR | Status: DC | PRN
  Administered 2014-08-15 (×2): 120 ug via INTRAVENOUS

## 2014-08-15 MED ORDER — SODIUM CHLORIDE 0.9 % IJ SOLN
INTRAMUSCULAR | Status: AC
  Filled 2014-08-15: qty 10

## 2014-08-15 MED ORDER — EPHEDRINE SULFATE 50 MG/ML IJ SOLN
INTRAMUSCULAR | Status: AC
  Filled 2014-08-15: qty 1

## 2014-08-15 MED ORDER — SCOPOLAMINE 1 MG/3DAYS TD PT72
MEDICATED_PATCH | TRANSDERMAL | Status: AC
  Administered 2014-08-15: 1 via TRANSDERMAL
  Filled 2014-08-15: qty 1

## 2014-08-15 MED ORDER — DEXAMETHASONE SODIUM PHOSPHATE 4 MG/ML IJ SOLN
INTRAMUSCULAR | Status: DC | PRN
  Administered 2014-08-15: 4 mg via INTRAVENOUS

## 2014-08-15 MED ORDER — FENTANYL CITRATE 0.05 MG/ML IJ SOLN
INTRAMUSCULAR | Status: DC | PRN
  Administered 2014-08-15: 100 ug via INTRAVENOUS
  Administered 2014-08-15: 150 ug via INTRAVENOUS

## 2014-08-15 MED ORDER — DEXAMETHASONE SODIUM PHOSPHATE 4 MG/ML IJ SOLN
INTRAMUSCULAR | Status: AC
  Filled 2014-08-15: qty 1

## 2014-08-15 MED ORDER — SUCCINYLCHOLINE CHLORIDE 20 MG/ML IJ SOLN
INTRAMUSCULAR | Status: AC
  Filled 2014-08-15: qty 1

## 2014-08-15 MED ORDER — PROPOFOL 10 MG/ML IV BOLUS
INTRAVENOUS | Status: DC | PRN
  Administered 2014-08-15: 200 mg via INTRAVENOUS

## 2014-08-15 MED ORDER — KETOROLAC TROMETHAMINE 30 MG/ML IJ SOLN
INTRAMUSCULAR | Status: AC
  Filled 2014-08-15: qty 1

## 2014-08-15 MED ORDER — MIDAZOLAM HCL 2 MG/2ML IJ SOLN
INTRAMUSCULAR | Status: AC
  Filled 2014-08-15: qty 2

## 2014-08-15 MED ORDER — BUPIVACAINE-EPINEPHRINE (PF) 0.25% -1:200000 IJ SOLN
INTRAMUSCULAR | Status: AC
  Filled 2014-08-15: qty 30

## 2014-08-15 MED ORDER — HYDROMORPHONE HCL 1 MG/ML IJ SOLN: 0.2500 mg | INTRAMUSCULAR | Status: DC | PRN

## 2014-08-15 MED ORDER — BUPIVACAINE-EPINEPHRINE 0.25% -1:200000 IJ SOLN
INTRAMUSCULAR | Status: DC | PRN
  Administered 2014-08-15: 30 mL

## 2014-08-15 MED ORDER — ROCURONIUM BROMIDE 50 MG/5ML IV SOLN
INTRAVENOUS | Status: AC
  Filled 2014-08-15: qty 1

## 2014-08-15 MED ORDER — LACTATED RINGERS IV SOLN
INTRAVENOUS | Status: DC | PRN
  Administered 2014-08-15 (×2): via INTRAVENOUS

## 2014-08-15 MED ORDER — PROPOFOL 10 MG/ML IV BOLUS
INTRAVENOUS | Status: AC
  Filled 2014-08-15: qty 20

## 2014-08-15 MED ORDER — ALBUTEROL SULFATE HFA 108 (90 BASE) MCG/ACT IN AERS
INHALATION_SPRAY | RESPIRATORY_TRACT | Status: DC | PRN
  Administered 2014-08-15 (×3): 6 via RESPIRATORY_TRACT

## 2014-08-15 MED ORDER — SUCCINYLCHOLINE CHLORIDE 20 MG/ML IJ SOLN
INTRAMUSCULAR | Status: DC | PRN
  Administered 2014-08-15: 60 mg via INTRAVENOUS

## 2014-08-15 MED ORDER — KETOROLAC TROMETHAMINE 30 MG/ML IJ SOLN
INTRAMUSCULAR | Status: DC | PRN
  Administered 2014-08-15: 30 mg via INTRAVENOUS

## 2014-08-15 MED ORDER — ACETAMINOPHEN 10 MG/ML IV SOLN
INTRAVENOUS | Status: AC
  Administered 2014-08-15: 1000 mg via INTRAVENOUS
  Filled 2014-08-15: qty 100

## 2014-08-15 MED ORDER — 0.9 % SODIUM CHLORIDE (POUR BTL) OPTIME
TOPICAL | Status: DC | PRN
  Administered 2014-08-15: 1000 mL

## 2014-08-15 MED ORDER — FENTANYL CITRATE 0.05 MG/ML IJ SOLN
INTRAMUSCULAR | Status: AC
  Filled 2014-08-15: qty 5

## 2014-08-15 MED ORDER — MIDAZOLAM HCL 5 MG/5ML IJ SOLN
INTRAMUSCULAR | Status: DC | PRN
  Administered 2014-08-15: 2 mg via INTRAVENOUS

## 2014-08-15 SURGICAL SUPPLY — 42 items
APPLIER CLIP 5 13 M/L LIGAMAX5 (MISCELLANEOUS) ×2
BANDAGE ADH SHEER 1  50/CT (GAUZE/BANDAGES/DRESSINGS) ×8 IMPLANT
BENZOIN TINCTURE PRP APPL 2/3 (GAUZE/BANDAGES/DRESSINGS) ×2 IMPLANT
BLADE SURG CLIPPER 3M 9600 (MISCELLANEOUS) IMPLANT
CANISTER SUCTION 2500CC (MISCELLANEOUS) ×2 IMPLANT
CHLORAPREP W/TINT 26ML (MISCELLANEOUS) ×2 IMPLANT
CLIP APPLIE 5 13 M/L LIGAMAX5 (MISCELLANEOUS) ×1 IMPLANT
COVER MAYO STAND STRL (DRAPES) IMPLANT
COVER SURGICAL LIGHT HANDLE (MISCELLANEOUS) ×2 IMPLANT
DRAPE C-ARM 42X72 X-RAY (DRAPES) IMPLANT
DRAPE LAPAROSCOPIC ABDOMINAL (DRAPES) ×2 IMPLANT
ELECT REM PT RETURN 9FT ADLT (ELECTROSURGICAL) ×2
ELECTRODE REM PT RTRN 9FT ADLT (ELECTROSURGICAL) ×1 IMPLANT
GLOVE BIO SURGEON STRL SZ 6.5 (GLOVE) ×2 IMPLANT
GLOVE BIOGEL PI IND STRL 6.5 (GLOVE) ×1 IMPLANT
GLOVE BIOGEL PI IND STRL 7.0 (GLOVE) ×1 IMPLANT
GLOVE BIOGEL PI INDICATOR 6.5 (GLOVE) ×1
GLOVE BIOGEL PI INDICATOR 7.0 (GLOVE) ×1
GLOVE SURG SIGNA 7.5 PF LTX (GLOVE) ×2 IMPLANT
GLOVE SURG SS PI 7.0 STRL IVOR (GLOVE) ×2 IMPLANT
GOWN STRL REUS W/ TWL LRG LVL3 (GOWN DISPOSABLE) ×3 IMPLANT
GOWN STRL REUS W/ TWL XL LVL3 (GOWN DISPOSABLE) ×1 IMPLANT
GOWN STRL REUS W/TWL LRG LVL3 (GOWN DISPOSABLE) ×3
GOWN STRL REUS W/TWL XL LVL3 (GOWN DISPOSABLE) ×1
KIT BASIN OR (CUSTOM PROCEDURE TRAY) ×2 IMPLANT
KIT ROOM TURNOVER OR (KITS) ×2 IMPLANT
NS IRRIG 1000ML POUR BTL (IV SOLUTION) ×2 IMPLANT
PAD ARMBOARD 7.5X6 YLW CONV (MISCELLANEOUS) ×2 IMPLANT
POUCH SPECIMEN RETRIEVAL 10MM (ENDOMECHANICALS) ×2 IMPLANT
SCISSORS LAP 5X35 DISP (ENDOMECHANICALS) ×2 IMPLANT
SET IRRIG TUBING LAPAROSCOPIC (IRRIGATION / IRRIGATOR) ×2 IMPLANT
SLEEVE ENDOPATH XCEL 5M (ENDOMECHANICALS) ×4 IMPLANT
SPECIMEN JAR SMALL (MISCELLANEOUS) ×2 IMPLANT
STRIP CLOSURE SKIN 1/2X4 (GAUZE/BANDAGES/DRESSINGS) ×2 IMPLANT
SUT MON AB 4-0 PC3 18 (SUTURE) ×4 IMPLANT
SUT VICRYL 0 UR6 27IN ABS (SUTURE) ×2 IMPLANT
TOWEL OR 17X24 6PK STRL BLUE (TOWEL DISPOSABLE) IMPLANT
TOWEL OR 17X26 10 PK STRL BLUE (TOWEL DISPOSABLE) ×2 IMPLANT
TRAY LAPAROSCOPIC (CUSTOM PROCEDURE TRAY) ×2 IMPLANT
TROCAR XCEL BLUNT TIP 100MML (ENDOMECHANICALS) ×2 IMPLANT
TROCAR XCEL NON-BLD 5MMX100MML (ENDOMECHANICALS) ×2 IMPLANT
TUBING INSUFFLATION (TUBING) ×2 IMPLANT

## 2014-08-15 NOTE — Anesthesia Preprocedure Evaluation (Signed)
Anesthesia Evaluation  Patient identified by MRN, date of birth, ID band  Reviewed: Allergy & Precautions, H&P , NPO status , Patient's Chart, lab work & pertinent test results  History of Anesthesia Complications (+) PONV and history of anesthetic complications  Airway Mallampati: II  TM Distance: >3 FB Neck ROM: Full    Dental  (+) Teeth Intact   Pulmonary Current Smoker,  breath sounds clear to auscultation        Cardiovascular + CAD and + Past MI Rhythm:Regular     Neuro/Psych PSYCHIATRIC DISORDERS Anxiety Depression negative neurological ROS     GI/Hepatic Neg liver ROS, GERD-  Medicated and Controlled,  Endo/Other  Morbid obesity  Renal/GU negative Renal ROS     Musculoskeletal   Abdominal   Peds  Hematology negative hematology ROS (+)   Anesthesia Other Findings   Reproductive/Obstetrics                             Anesthesia Physical Anesthesia Plan  ASA: III  Anesthesia Plan: General   Post-op Pain Management:    Induction: Intravenous  Airway Management Planned: Oral ETT  Additional Equipment: None  Intra-op Plan:   Post-operative Plan: Extubation in OR  Informed Consent: I have reviewed the patients History and Physical, chart, labs and discussed the procedure including the risks, benefits and alternatives for the proposed anesthesia with the patient or authorized representative who has indicated his/her understanding and acceptance.   Dental advisory given  Plan Discussed with: CRNA and Surgeon  Anesthesia Plan Comments:         Anesthesia Quick Evaluation

## 2014-08-15 NOTE — Transfer of Care (Signed)
Immediate Anesthesia Transfer of Care Note  Patient: Nina Taylor  Procedure(s) Performed: Procedure(s) with comments: LAPAROSCOPIC CHOLECYSTECTOMY  (N/A) - Laparoscopic cholecystectomy   Patient Location: PACU  Anesthesia Type:General  Level of Consciousness: awake, alert , oriented and patient cooperative  Airway & Oxygen Therapy: Patient Spontanous Breathing and Patient connected to face mask oxygen  Post-op Assessment: Report given to PACU RN, Post -op Vital signs reviewed and stable and Patient moving all extremities  Post vital signs: Reviewed and stable  Complications: No apparent anesthesia complications

## 2014-08-15 NOTE — Discharge Instructions (Signed)
[facility] ______[facility] LAPAROSCOPIC SURGERY: POST OP INSTRUCTIONS Always review your discharge instruction sheet given to you by the facility where your surgery was performed. IF YOU HAVE DISABILITY OR FAMILY LEAVE FORMS, YOU MUST BRING THEM TO THE OFFICE FOR PROCESSING.   DO NOT GIVE THEM TO YOUR DOCTOR.  1. A prescription for pain medication may be given to you upon discharge.  Take your pain medication as prescribed, if needed.  If narcotic pain medicine is not needed, then you may take acetaminophen (Tylenol) or ibuprofen (Advil) as needed. 2. Take your usually prescribed medications unless otherwise directed. 3. If you need a refill on your pain medication, please contact your pharmacy.  They will contact our office to request authorization. Prescriptions will not be filled after 5pm or on week-ends. 4. You should follow a light diet the first few days after arrival home, such as soup and crackers, etc.  Be sure to include lots of fluids daily. 5. Most patients will experience some swelling and bruising in the area of the incisions.  Ice packs will help.  Swelling and bruising can take several days to resolve.  6. It is common to experience some constipation if taking pain medication after surgery.  Increasing fluid intake and taking a stool softener (such as Colace) will usually help or prevent this problem from occurring.  A mild laxative (Milk of Magnesia or Miralax) should be taken according to package instructions if there are no bowel movements after 48 hours. 7. Unless discharge instructions indicate otherwise, you may remove your bandages 24-48 hours after surgery, and you may shower at that time.  You may have steri-strips (small skin tapes) in place directly over the incision.  These strips should be left on the skin for 7-10 days.  If your surgeon used skin glue on the incision, you may shower in 24 hours.  The glue will flake off over the next 2-3 weeks.  Any sutures or  staples will be removed at the office during your follow-up visit. 8. ACTIVITIES:  You may resume regular (light) daily activities beginning the next day--such as daily self-care, walking, climbing stairs--gradually increasing activities as tolerated.  You may have sexual intercourse when it is comfortable.  Refrain from any heavy lifting or straining until approved by your doctor. a. You may drive when you are no longer taking prescription pain medication, you can comfortably wear a seatbelt, and you can safely maneuver your car and apply brakes. b. RETURN TO WORK:  __________________________________________________________ 9. You should see your doctor in the office for a follow-up appointment approximately 2-3 weeks after your surgery.  Make sure that you call for this appointment within a day or two after you arrive home to insure a convenient appointment time. 10. OTHER INSTRUCTIONS: ___ibuprofen and ice pack also for pain 11. You may shower starting tomorrow 12. No lifting more than 15 pounds for 2 weeks_______________________________________________________________________________________________________________________ __________________________________________________________________________________________________________________________ WHEN TO CALL YOUR DOCTOR: 1. Fever over 101.0 2. Inability to urinate 3. Continued bleeding from incision. 4. Increased pain, redness, or drainage from the incision. 5. Increasing abdominal pain  The clinic staff is available to answer your questions during regular business hours.  Please dont hesitate to call and ask to speak to one of the nurses for clinical concerns.  If you have a medical emergency, go to the nearest emergency room or call 911.  A surgeon from [facility] is always on call at the hospital. [address] [place], [place],  [place]  [address] ? [address], [place], [place]   [address] [phone] ? [phone] ?  FAX [phone] Web site: [email or web address]  What to eat:  For your first meals, you should eat lightly; only small meals initially.  If you do not have nausea, you may eat larger meals.  Avoid spicy, greasy and heavy food.

## 2014-08-15 NOTE — Anesthesia Postprocedure Evaluation (Signed)
  Anesthesia Post-op Note  Patient: Nina Taylor  Procedure(s) Performed: Procedure(s) with comments: LAPAROSCOPIC CHOLECYSTECTOMY  (N/A) - Laparoscopic cholecystectomy   Patient Location: PACU  Anesthesia Type:General  Level of Consciousness: awake, alert  and oriented  Airway and Oxygen Therapy: Patient Spontanous Breathing  Post-op Pain: mild  Post-op Assessment: Post-op Vital signs reviewed, Patient's Cardiovascular Status Stable, Respiratory Function Stable, Patent Airway, No signs of Nausea or vomiting and Pain level controlled  Post-op Vital Signs: Reviewed and stable  Last Vitals:  Filed Vitals:   08/15/14 1048  BP: 117/58  Pulse: 75  Temp:   Resp:     Complications: No apparent anesthesia complications

## 2014-08-15 NOTE — Anesthesia Procedure Notes (Signed)
Procedure Name: Intubation Date/Time: 08/15/2014 8:42 AM Performed by: Trixie Deis A Pre-anesthesia Checklist: Patient identified, Timeout performed, Emergency Drugs available, Suction available and Patient being monitored Patient Re-evaluated:Patient Re-evaluated prior to inductionOxygen Delivery Method: Circle system utilized Preoxygenation: Pre-oxygenation with 100% oxygen Intubation Type: IV induction Ventilation: Mask ventilation without difficulty and Oral airway inserted - appropriate to patient size Laryngoscope Size: Mac and 3 Grade View: Grade I Tube type: Oral Tube size: 7.0 mm Number of attempts: 1 Airway Equipment and Method: Stylet Placement Confirmation: ETT inserted through vocal cords under direct vision,  breath sounds checked- equal and bilateral and positive ETCO2 Secured at: 21 cm Tube secured with: Tape Dental Injury: Teeth and Oropharynx as per pre-operative assessment

## 2014-08-15 NOTE — Interval H&P Note (Signed)
History and Physical Interval Note: no change in H and P  08/15/2014 7:03 AM  Nina Taylor  has presented today for surgery, with the diagnosis of Symptomatic Cholelithiasis  The various methods of treatment have been discussed with the patient and family. After consideration of risks, benefits and other options for treatment, the patient has consented to  Procedure(s): LAPAROSCOPIC CHOLECYSTECTOMY WITH INTRAOPERATIVE CHOLANGIOGRAM (N/A) as a surgical intervention .  The patient's history has been reviewed, patient examined, no change in status, stable for surgery.  I have reviewed the patient's chart and labs.  Questions were answered to the patient's satisfaction.     Derin Granquist A

## 2014-08-15 NOTE — Op Note (Signed)
Laparoscopic Cholecystectomy Procedure Note  Indications: This patient presents with symptomatic gallbladder disease and will undergo laparoscopic cholecystectomy.  Pre-operative Diagnosis: Calculus of gallbladder without mention of cholecystitis or obstruction  Post-operative Diagnosis: Same  Surgeon: Coralie Keens A   Assistants: 0  Anesthesia: General endotracheal anesthesia  ASA Class: 3  Procedure Details  The patient was seen again in the Holding Room. The risks, benefits, complications, treatment options, and expected outcomes were discussed with the patient. The possibilities of reaction to medication, pulmonary aspiration, perforation of viscus, bleeding, recurrent infection, finding a normal gallbladder, the need for additional procedures, failure to diagnose a condition, the possible need to convert to an open procedure, and creating a complication requiring transfusion or operation were discussed with the patient. The likelihood of improving the patient's symptoms with return to their baseline status is good.  The patient and/or family concurred with the proposed plan, giving informed consent. The site of surgery properly noted. The patient was taken to Operating Room, identified as [facility] and the procedure verified as Laparoscopic Cholecystectomy with Intraoperative Cholangiogram. A Time Out was held and the above information confirmed.  Prior to the induction of general anesthesia, antibiotic prophylaxis was administered. General endotracheal anesthesia was then administered and tolerated well. After the induction, the abdomen was prepped with Chloraprep and draped in sterile fashion. The patient was positioned in the supine position.  Local anesthetic agent was injected into the skin near the umbilicus and an incision made. We dissected down to the abdominal fascia with blunt dissection.  The fascia was incised vertically and we entered the peritoneal cavity bluntly.  A  pursestring suture of 0-Vicryl was placed around the fascial opening.  The Hasson cannula was inserted and secured with the stay suture.  Pneumoperitoneum was then created with CO2 and tolerated well without any adverse changes in the patient's vital signs. A 5-mm port was placed in the subxiphoid position.  Two 5-mm ports were placed in the right upper quadrant. All skin incisions were infiltrated with a local anesthetic agent before making the incision and placing the trocars.   We positioned the patient in reverse Trendelenburg, tilted slightly to the patient's left.  The gallbladder was identified, the fundus grasped and retracted cephalad. Adhesions were lysed bluntly and with the electrocautery where indicated, taking care not to injure any adjacent organs or viscus. The infundibulum was grasped and retracted laterally, exposing the peritoneum overlying the triangle of Calot. This was then divided and exposed in a blunt fashion. The cystic duct was clearly identified and bluntly dissected circumferentially. A critical view of the cystic duct and cystic artery was obtained.  The cystic duct was then ligated with clips and divided. The cystic artery was, dissected free, ligated with clips and divided as well.   The gallbladder was dissected from the liver bed in retrograde fashion with the electrocautery. The gallbladder was removed and placed in an Endocatch sac. The liver bed was irrigated and inspected. Hemostasis was achieved with the electrocautery. Copious irrigation was utilized and was repeatedly aspirated until clear.  The gallbladder and Endocatch sac were then removed through the umbilical port site.  The pursestring suture was used to close the umbilical fascia.    We again inspected the right upper quadrant for hemostasis.  Pneumoperitoneum was released as we removed the trocars.  4-0 Monocryl was used to close the skin.   Benzoin, steri-strips, and clean dressings were applied. The patient  was then extubated and brought to the recovery room in  stable condition. Instrument, sponge, and needle counts were correct at closure and at the conclusion of the case.   Findings: Cholecystitis with Cholelithiasis  Estimated Blood Loss: Minimal         Drains: 0         Specimens: Gallbladder           Complications: None; patient tolerated the procedure well.         Disposition: PACU - hemodynamically stable.         Condition: stable

## 2014-08-17 ENCOUNTER — Encounter (HOSPITAL_COMMUNITY): Payer: Self-pay | Admitting: Surgery

## 2014-08-21 ENCOUNTER — Telehealth (INDEPENDENT_AMBULATORY_CARE_PROVIDER_SITE_OTHER): Payer: Self-pay

## 2014-08-21 NOTE — Telephone Encounter (Signed)
i have no problem with that

## 2014-08-21 NOTE — Telephone Encounter (Signed)
Pt had lap chole 12/16 & has F/U appt here 09/19/14.  She would like to go back to work on Sep 10, 2014. Is this ok or do you want to decide closer to the date?  She reports doing well, no fever, no N&V.  She still needs to take pain medications once or twice a day.  Thanks. SL

## 2014-08-27 ENCOUNTER — Telehealth: Payer: Self-pay | Admitting: *Deleted

## 2014-08-27 NOTE — Telephone Encounter (Signed)
Notes Recorded by Laurine Blazer, LPN on 44/81/8563 at 5:43 PM Left message to return call.

## 2014-08-27 NOTE — Telephone Encounter (Signed)
-----   Message from Herminio Commons, MD sent at 08/16/2014  2:44 PM EST ----- Normal function with moderate LVH. Overall good results. She can f/u with me in 1 year.

## 2014-09-05 ENCOUNTER — Encounter: Payer: Self-pay | Admitting: *Deleted

## 2014-09-05 NOTE — Telephone Encounter (Signed)
Patient notified of results by letter  

## 2014-10-23 ENCOUNTER — Encounter: Payer: Self-pay | Admitting: Physician Assistant

## 2014-10-23 ENCOUNTER — Ambulatory Visit (INDEPENDENT_AMBULATORY_CARE_PROVIDER_SITE_OTHER): Payer: Managed Care, Other (non HMO) | Admitting: Physician Assistant

## 2014-10-23 VITALS — BP 130/66 | HR 84 | Temp 97.9°F | Ht 68.0 in | Wt 251.0 lb

## 2014-10-23 DIAGNOSIS — H6120 Impacted cerumen, unspecified ear: Secondary | ICD-10-CM | POA: Insufficient documentation

## 2014-10-23 DIAGNOSIS — H6122 Impacted cerumen, left ear: Secondary | ICD-10-CM

## 2014-10-23 DIAGNOSIS — J029 Acute pharyngitis, unspecified: Secondary | ICD-10-CM

## 2014-10-23 DIAGNOSIS — J039 Acute tonsillitis, unspecified: Secondary | ICD-10-CM

## 2014-10-23 LAB — POCT RAPID STREP A (OFFICE): RAPID STREP A SCREEN: NEGATIVE

## 2014-10-23 MED ORDER — AMOXICILLIN 875 MG PO TABS: 875.0000 mg | ORAL_TABLET | Freq: Two times a day (BID) | ORAL | Status: DC

## 2014-10-23 MED ORDER — PREDNISONE 50 MG PO TABS: ORAL_TABLET | ORAL | Status: DC

## 2014-10-23 MED ORDER — CARBAMIDE PEROXIDE 6.5 % OT SOLN: 5.0000 [drp] | Freq: Two times a day (BID) | OTIC | Status: DC

## 2014-10-23 NOTE — Patient Instructions (Signed)

## 2014-10-23 NOTE — Progress Notes (Signed)
   Subjective:    Patient ID: Nina Taylor, female    DOB: 05/25/1956, 59 y.o.   MRN: [id number]  HPI Pt presents to the clinic with sudden ST that started yesterday. Strep was going around at work. Have body aches and chills. Not taken anything to make better. Right side of neck painful and swollen. Dry to wet cough mostly in am. No fever. Bilateral ears feel tight and congested. Lots of sinus pressure.    Review of Systems  All other systems reviewed and are negative.      Objective:   Physical Exam  Constitutional: She is oriented to person, place, and time. She appears well-developed and well-nourished.  HENT:  Head: Normocephalic and atraumatic.  Right Ear: External ear normal.  Nose: Nose normal.  Left ear impacted with cerumen. After irrigation erythema in canal but not able to view TM.   Oropharynx erythematous with tonsilar swelling right greater than left. No exudate.   Eyes: Conjunctivae are normal. Right eye exhibits no discharge. Left eye exhibits no discharge.  Neck: Normal range of motion. Neck supple.  Enlarged right sided cervical adenopathy with tenderness to palpation.   Cardiovascular: Normal rate, regular rhythm and normal heart sounds.   Pulmonary/Chest: Effort normal and breath sounds normal. She has no wheezes.  Neurological: She is alert and oriented to person, place, and time.  Skin: Skin is dry.  Psychiatric: She has a normal mood and affect. Her behavior is normal.          Assessment & Plan:  Acute tonsillitis/acute pharyngitis- rapid strep negative. Right tonsils swollen even into anterior cervical right sided nodes. Will treat with amoxil for 10 days and prednisone for 5 days. Gargle with salt water.  Tylenol/motrin for fever and pain. Follow up as needed.   Cerumen impaction, left- nurse irrigated. Not able to get a lot. Pt was uncomfortable. Sent debrox to pharmacy.

## 2015-01-14 ENCOUNTER — Emergency Department (INDEPENDENT_AMBULATORY_CARE_PROVIDER_SITE_OTHER)
Admission: EM | Admit: 2015-01-14 | Discharge: 2015-01-14 | Disposition: A | Discharge status: Home / Self Care | Payer: Managed Care, Other (non HMO) | Source: Home / Self Care | Attending: Family Medicine | Admitting: Family Medicine

## 2015-01-14 ENCOUNTER — Emergency Department (INDEPENDENT_AMBULATORY_CARE_PROVIDER_SITE_OTHER): Payer: Managed Care, Other (non HMO)

## 2015-01-14 ENCOUNTER — Encounter: Payer: Self-pay | Admitting: *Deleted

## 2015-01-14 DIAGNOSIS — M25571 Pain in right ankle and joints of right foot: Secondary | ICD-10-CM | POA: Diagnosis not present

## 2015-01-14 DIAGNOSIS — S93401A Sprain of unspecified ligament of right ankle, initial encounter: Secondary | ICD-10-CM

## 2015-01-14 DIAGNOSIS — S93601A Unspecified sprain of right foot, initial encounter: Secondary | ICD-10-CM

## 2015-01-14 DIAGNOSIS — S20212A Contusion of left front wall of thorax, initial encounter: Secondary | ICD-10-CM | POA: Diagnosis not present

## 2015-01-14 DIAGNOSIS — R0781 Pleurodynia: Secondary | ICD-10-CM

## 2015-01-14 MED ORDER — TRAMADOL HCL 50 MG PO TABS: 50.0000 mg | ORAL_TABLET | Freq: Four times a day (QID) | ORAL | Status: DC | PRN

## 2015-01-14 MED ORDER — KETOROLAC TROMETHAMINE 60 MG/2ML IM SOLN
60.0000 mg | Freq: Once | INTRAMUSCULAR | Status: AC
  Administered 2015-01-14: 60 mg via INTRAMUSCULAR

## 2015-01-14 NOTE — Discharge Instructions (Signed)
Apply ice pack for 30 minutes every 1 to 2 hours today and tomorrow.  Elevate.  Use crutches until follow-up by sports medicine specialist.  Wear Ace wrap until swelling decreases.  May take Aleve, 2 tabs every 12 hours with food. Use rib belt as needed.   Ankle Sprain An ankle sprain is an injury to the strong, fibrous tissues (ligaments) that hold the bones of your ankle joint together.  CAUSES An ankle sprain is usually caused by a fall or by twisting your ankle. Ankle sprains most commonly occur when you step on the outer edge of your foot, and your ankle turns inward. People who participate in sports are more prone to these types of injuries.  SYMPTOMS   Pain in your ankle. The pain may be present at rest or only when you are trying to stand or walk.  Swelling.  Bruising. Bruising may develop immediately or within 1 to 2 days after your injury.  Difficulty standing or walking, particularly when turning corners or changing directions. DIAGNOSIS  Your caregiver will ask you details about your injury and perform a physical exam of your ankle to determine if you have an ankle sprain. During the physical exam, your caregiver will press on and apply pressure to specific areas of your foot and ankle. Your caregiver will try to move your ankle in certain ways. An X-ray exam may be done to be sure a bone was not broken or a ligament did not separate from one of the bones in your ankle (avulsion fracture).  TREATMENT  Certain types of braces can help stabilize your ankle. Your caregiver can make a recommendation for this. Your caregiver may recommend the use of medicine for pain. If your sprain is severe, your caregiver may refer you to a surgeon who helps to restore function to parts of your skeletal system (orthopedist) or a physical therapist. Holcomb ice to your injury for 1-2 days or as directed by your caregiver. Applying ice helps to reduce inflammation and  pain.  Put ice in a plastic bag.  Place a towel between your skin and the bag.  Leave the ice on for 15-20 minutes at a time, every 2 hours while you are awake.  Only take over-the-counter or prescription medicines for pain, discomfort, or fever as directed by your caregiver.  Elevate your injured ankle above the level of your heart as much as possible for 2-3 days.  If your caregiver recommends crutches, use them as instructed. Gradually put weight on the affected ankle. Continue to use crutches or a cane until you can walk without feeling pain in your ankle.  If you have a plaster splint, wear the splint as directed by your caregiver. Do not rest it on anything harder than a pillow for the first 24 hours. Do not put weight on it. Do not get it wet. You may take it off to take a shower or bath.  You may have been given an elastic bandage to wear around your ankle to provide support. If the elastic bandage is too tight (you have numbness or tingling in your foot or your foot becomes cold and blue), adjust the bandage to make it comfortable.  If you have an air splint, you may blow more air into it or let air out to make it more comfortable. You may take your splint off at night and before taking a shower or bath. Wiggle your toes in the splint several times per  day to decrease swelling. SEEK MEDICAL CARE IF:   You have rapidly increasing bruising or swelling.  Your toes feel extremely cold or you lose feeling in your foot.  Your pain is not relieved with medicine. SEEK IMMEDIATE MEDICAL CARE IF:  Your toes are numb or blue.  You have severe pain that is increasing. MAKE SURE YOU:   Understand these instructions.  Will watch your condition.  Will get help right away if you are not doing well or get worse. Document Released: 08/24/2005 Document Revised: 05/18/2012 Document Reviewed: 09/05/2011 [facility] Patient Information 2015 [place], [place]. This information is not intended to  replace advice given to you by your health care provider. Make sure you discuss any questions you have with your health care provider.    Chest Contusion A chest contusion is a deep bruise on your chest area. Contusions are the result of an injury that caused bleeding under the skin. A chest contusion may involve bruising of the skin, muscles, or ribs. The contusion may turn blue, purple, or yellow. Minor injuries will give you a painless contusion, but more severe contusions may stay painful and swollen for a few weeks. CAUSES  A contusion is usually caused by a blow, trauma, or direct force to an area of the body. SYMPTOMS   Swelling and redness of the injured area.  Discoloration of the injured area.  Tenderness and soreness of the injured area.  Pain. DIAGNOSIS  The diagnosis can be made by taking a history and performing a physical exam. An X-ray, CT scan, or MRI may be needed to determine if there were any associated injuries, such as broken bones (fractures) or internal injuries. TREATMENT  Often, the best treatment for a chest contusion is resting, icing, and applying cold compresses to the injured area. Deep breathing exercises may be recommended to reduce the risk of pneumonia. Over-the-counter medicines may also be recommended for pain control. HOME CARE INSTRUCTIONS   Put ice on the injured area.  Put ice in a plastic bag.  Place a towel between your skin and the bag.  Leave the ice on for 15-20 minutes, 03-04 times a day.  Only take over-the-counter or prescription medicines as directed by your caregiver. Your caregiver may recommend avoiding anti-inflammatory medicines (aspirin, ibuprofen, and naproxen) for 48 hours because these medicines may increase bruising.  Rest the injured area.  Perform deep-breathing exercises as directed by your caregiver.  Stop smoking if you smoke.  Do not lift objects over 5 pounds (2.3 kg) for 3 days or longer if recommended by your  caregiver. SEEK IMMEDIATE MEDICAL CARE IF:   You have increased bruising or swelling.  You have pain that is getting worse.  You have difficulty breathing.  You have dizziness, weakness, or fainting.  You have blood in your urine or stool.  You cough up or vomit blood.  Your swelling or pain is not relieved with medicines. MAKE SURE YOU:   Understand these instructions.  Will watch your condition.  Will get help right away if you are not doing well or get worse. Document Released: 05/19/2001 Document Revised: 05/18/2012 Document Reviewed: 02/15/2012 [facility] Patient Information 2015 [place], [place]. This information is not intended to replace advice given to you by your health care provider. Make sure you discuss any questions you have with your health care provider.
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
## 2015-03-19 ENCOUNTER — Encounter: Payer: Managed Care, Other (non HMO) | Admitting: Family Medicine

## 2016-05-27 HISTORY — PX: ESOPHAGOGASTRODUODENOSCOPY: SHX1529

## 2017-01-01 ENCOUNTER — Encounter: Payer: Managed Care, Other (non HMO) | Admitting: Physician Assistant

## 2017-01-04 ENCOUNTER — Encounter: Payer: Managed Care, Other (non HMO) | Admitting: Physician Assistant

## 2017-01-08 ENCOUNTER — Ambulatory Visit (INDEPENDENT_AMBULATORY_CARE_PROVIDER_SITE_OTHER): Payer: 59

## 2017-01-08 ENCOUNTER — Encounter: Payer: Self-pay | Admitting: Physician Assistant

## 2017-01-08 ENCOUNTER — Ambulatory Visit (INDEPENDENT_AMBULATORY_CARE_PROVIDER_SITE_OTHER): Payer: 59 | Admitting: Physician Assistant

## 2017-01-08 VITALS — BP 126/76 | HR 89 | Ht 68.0 in | Wt 240.0 lb

## 2017-01-08 DIAGNOSIS — F331 Major depressive disorder, recurrent, moderate: Secondary | ICD-10-CM | POA: Diagnosis not present

## 2017-01-08 DIAGNOSIS — M25475 Effusion, left foot: Secondary | ICD-10-CM

## 2017-01-08 DIAGNOSIS — K2100 Gastro-esophageal reflux disease with esophagitis, without bleeding: Secondary | ICD-10-CM

## 2017-01-08 DIAGNOSIS — Z0000 Encounter for general adult medical examination without abnormal findings: Secondary | ICD-10-CM | POA: Diagnosis not present

## 2017-01-08 DIAGNOSIS — Z716 Tobacco abuse counseling: Secondary | ICD-10-CM

## 2017-01-08 DIAGNOSIS — Z131 Encounter for screening for diabetes mellitus: Secondary | ICD-10-CM | POA: Diagnosis not present

## 2017-01-08 DIAGNOSIS — Z720 Tobacco use: Secondary | ICD-10-CM | POA: Diagnosis not present

## 2017-01-08 DIAGNOSIS — Z13220 Encounter for screening for lipoid disorders: Secondary | ICD-10-CM | POA: Diagnosis not present

## 2017-01-08 DIAGNOSIS — Z23 Encounter for immunization: Secondary | ICD-10-CM

## 2017-01-08 DIAGNOSIS — M7732 Calcaneal spur, left foot: Secondary | ICD-10-CM

## 2017-01-08 DIAGNOSIS — Z1231 Encounter for screening mammogram for malignant neoplasm of breast: Secondary | ICD-10-CM

## 2017-01-08 MED ORDER — NAPROXEN 500 MG PO TABS: 500.0000 mg | ORAL_TABLET | Freq: Two times a day (BID) | ORAL | 3 refills | Status: DC

## 2017-01-08 MED ORDER — VILAZODONE HCL 20 MG PO TABS: 1.0000 | ORAL_TABLET | Freq: Every day | ORAL | 1 refills | Status: DC

## 2017-01-08 MED ORDER — VARENICLINE TARTRATE 0.5 MG X 11 & 1 MG X 42 PO MISC: ORAL | 0 refills | Status: DC

## 2017-01-08 NOTE — Progress Notes (Signed)
Subjective:    Patient ID: Nina Taylor, female    DOB: 01-26-1956, 61 y.o.   MRN: [id number]  HPI Pt is a 61 yo female who presents to the clinic for CPE. She has not been seen in clinic in a while due to a move. She has now moved back in town and wanted to be seen.   .. Active Ambulatory Problems    Diagnosis Date Noted  . GERD (gastroesophageal reflux disease) 05/23/2014  . Heart attack ([place]) 05/23/2014  . CAD (coronary artery disease) 05/23/2014  . Depression 05/23/2014  . Diverticulitis 05/23/2014  . History of diverticulitis 05/25/2014  . Calculus of gallbladder 05/25/2014  . Cerumen impaction 10/23/2014  . Swelling of foot joint, left 01/10/2017  . Morbid obesity ([place]) 01/10/2017   Resolved Ambulatory Problems    Diagnosis Date Noted  . No Resolved Ambulatory Problems   Past Medical History:  Diagnosis Date  . Anxiety   . Coronary artery disease   . Depression   . GERD (gastroesophageal reflux disease)   . Heart attack ([place]) 2011  . Pneumonia 2011  . PONV (postoperative nausea and vomiting)    .Marland Kitchen Family History  Problem Relation Age of Onset  . Cancer Other    .Marland Kitchen Social History   Social History  . Marital status: Married    Spouse name: N/A  . Number of children: N/A  . Years of education: N/A   Occupational History  . Not on file.   Social History Main Topics  . Smoking status: Current Every Day Smoker    Packs/day: 0.25    Years: 20.00    Types: Cigarettes    Start date: 09/10/1993  . Smokeless tobacco: Never Used  . Alcohol use 0.0 oz/week     Comment: wine rarely  . Drug use: No  . Sexual activity: Not Currently   Other Topics Concern  . Not on file   Social History Narrative  . No narrative on file   She does have a left dorsal foot that swells off and on for last week. Goes down at night and comes back in am. No intense pain but does ache from time to time. She has been on it a lot more during the move.  No known injury. Not tried  anything to make better.   She has been on zoloft and just has not noticed a lot of benefit with depression. She also feels like it has caused her to gain weight. She would like to switch.    Review of Systems    see HPI>  Objective:   Physical Exam  Constitutional: She is oriented to person, place, and time. She appears well-developed and well-nourished.  HENT:  Head: Normocephalic and atraumatic.  Right Ear: External ear normal.  Left Ear: External ear normal.  Nose: Nose normal.  Mouth/Throat: Oropharynx is clear and moist. No oropharyngeal exudate.  Eyes: Conjunctivae and EOM are normal. Pupils are equal, round, and reactive to light. Right eye exhibits no discharge. Left eye exhibits no discharge.  Neck: Normal range of motion. Neck supple. No thyromegaly present.  Cardiovascular: Normal rate, regular rhythm and normal heart sounds.   Pulmonary/Chest: Effort normal and breath sounds normal.  Abdominal: Soft. Bowel sounds are normal. She exhibits no distension and no mass. There is no tenderness. There is no rebound and no guarding.  Musculoskeletal: Normal range of motion.  Left dorsal foot with scant pitting edema. NROM. No bruising or warmth. Normal strength 5/5. Pedal  pulses 2 + and strong.  Lymphadenopathy:    She has no cervical adenopathy.  Neurological: She is alert and oriented to person, place, and time.  Skin: No rash noted.  Psychiatric: She has a normal mood and affect. Her behavior is normal.          Assessment & Plan:  Marland KitchenMarland KitchenJohari was seen today for annual exam and left foot pain.  Diagnoses and all orders for this visit:  Routine physical examination -     Lipid Panel w/reflex Direct LDL -     COMPLETE METABOLIC PANEL WITH GFR -     MM SCREENING BREAST TOMO BILATERAL; Future  Morbid obesity ([place])  Need for Tdap vaccination -     Tdap vaccine greater than or equal to 7yo IM  Gastroesophageal reflux disease with esophagitis  Visit for screening  mammogram -     MM SCREENING BREAST TOMO BILATERAL; Future  Screening for lipid disorders -     Lipid Panel w/reflex Direct LDL  Screening for diabetes mellitus -     COMPLETE METABOLIC PANEL WITH GFR  Swelling of foot joint, left -     naproxen (NAPROSYN) 500 MG tablet; Take 1 tablet (500 mg total) by mouth 2 (two) times daily with a meal. -     DG Foot Complete Left; Future  Encounter for smoking cessation counseling -     varenicline (CHANTIX STARTING MONTH PAK) 0.5 MG X 11 & 1 MG X 42 tablet; Take one 0.5mg  tablet by mouth once daily for 3 days, then increase to one 0.5mg  tablet twice daily for 3 days, then increase to one 1mg  tablet twice daily.  Moderate episode of recurrent major depressive disorder (HCC)  Other orders -     Vilazodone HCl 20 MG TABS; Take 1 tablet (20 mg total) by mouth daily.  .. Depression screen PHQ 2/9 01/08/2017  Decreased Interest 3  Down, Depressed, Hopeless 2  PHQ - 2 Score 5  Altered sleeping 2  Tired, decreased energy 2  Change in appetite 2  Feeling bad or failure about yourself  1  Trouble concentrating 3  Moving slowly or fidgety/restless 3  Suicidal thoughts 0  PHQ-9 Score 18     Mammogram ordered.  She is reading information on cologuard vs colonoscopy. She will let our office know.   She agreed to start chantix for smoking cessation. Side effects discussed. Follow up in 4 weeks. Stop smoking over those 4 weeks.   Taper off zoloft and titrate up on viibryd. Coupon card given. Follow up in 4-6 weeks.

## 2017-01-08 NOTE — Patient Instructions (Signed)

## 2017-01-09 LAB — COMPLETE METABOLIC PANEL WITH GFR
ALK PHOS: 78 U/L (ref 33–130)
ALT: 19 U/L (ref 6–29)
AST: 16 U/L (ref 10–35)
Albumin: 4 g/dL (ref 3.6–5.1)
BILIRUBIN TOTAL: 0.3 mg/dL (ref 0.2–1.2)
BUN: 13 mg/dL (ref 7–25)
CALCIUM: 9 mg/dL (ref 8.6–10.4)
CHLORIDE: 106 mmol/L (ref 98–110)
CO2: 27 mmol/L (ref 20–31)
Creat: 0.62 mg/dL (ref 0.50–0.99)
Glucose, Bld: 138 mg/dL — ABNORMAL HIGH (ref 65–99)
Potassium: 4 mmol/L (ref 3.5–5.3)
Sodium: 138 mmol/L (ref 135–146)
Total Protein: 6.7 g/dL (ref 6.1–8.1)

## 2017-01-09 LAB — LIPID PANEL W/REFLEX DIRECT LDL
CHOLESTEROL: 225 mg/dL — AB (ref <200)
HDL: 41 mg/dL — AB (ref >50)
LDL-Cholesterol: 158 mg/dL — ABNORMAL HIGH
NON-HDL CHOLESTEROL (CALC): 184 mg/dL — AB (ref <130)
Total CHOL/HDL Ratio: 5.5 Ratio — ABNORMAL HIGH (ref <5.0)
Triglycerides: 133 mg/dL (ref <150)

## 2017-01-10 DIAGNOSIS — M25475 Effusion, left foot: Secondary | ICD-10-CM | POA: Insufficient documentation

## 2017-01-11 NOTE — Progress Notes (Signed)
Call pt: elevated fasting sugar need to get an A!C. She will need another blood draw.  LDL is elevated and with elevated sugar and smoking history I would suggest we be a little more aggressive with statin and switch to lipitor. Are you ok with this?

## 2017-01-11 NOTE — Progress Notes (Signed)
Ok that does change the urgency of glucose but cholesterol is still very elevated. I would like to switch to lipitor or double simvastatin and recheck in 4-6 months.

## 2017-02-03 ENCOUNTER — Ambulatory Visit (INDEPENDENT_AMBULATORY_CARE_PROVIDER_SITE_OTHER): Payer: 59 | Admitting: Physician Assistant

## 2017-02-03 ENCOUNTER — Encounter: Payer: Self-pay | Admitting: Physician Assistant

## 2017-02-03 VITALS — BP 133/85 | HR 88 | Temp 98.0°F | Ht 68.0 in | Wt 244.0 lb

## 2017-02-03 DIAGNOSIS — J0110 Acute frontal sinusitis, unspecified: Secondary | ICD-10-CM

## 2017-02-03 DIAGNOSIS — J441 Chronic obstructive pulmonary disease with (acute) exacerbation: Secondary | ICD-10-CM

## 2017-02-03 MED ORDER — AZITHROMYCIN 250 MG PO TABS: ORAL_TABLET | ORAL | 0 refills | Status: DC

## 2017-02-03 MED ORDER — ALBUTEROL SULFATE HFA 108 (90 BASE) MCG/ACT IN AERS
2.0000 | INHALATION_SPRAY | Freq: Four times a day (QID) | RESPIRATORY_TRACT | 2 refills | Status: DC | PRN
Start: 2017-02-03 — End: 2019-03-22

## 2017-02-03 MED ORDER — HYDROCHLOROTHIAZIDE 12.5 MG PO TABS: 12.5000 mg | ORAL_TABLET | Freq: Every day | ORAL | 1 refills | Status: DC

## 2017-02-03 MED ORDER — PROMETHAZINE-CODEINE 6.25-10 MG/5ML PO SYRP: 5.0000 mL | ORAL_SOLUTION | Freq: Every day | ORAL | 0 refills | Status: DC

## 2017-02-03 MED ORDER — PREDNISONE 50 MG PO TABS: ORAL_TABLET | ORAL | 0 refills | Status: DC

## 2017-02-03 MED ORDER — IPRATROPIUM-ALBUTEROL 0.5-2.5 (3) MG/3ML IN SOLN
3.0000 mL | Freq: Once | RESPIRATORY_TRACT | Status: AC
  Administered 2017-02-03: 3 mL via RESPIRATORY_TRACT

## 2017-02-03 NOTE — Progress Notes (Signed)
Subjective:    Patient ID: Nina Taylor, female    DOB: [birth year], 61 y.o.   MRN: [id number]  HPI  Pt is a 61 yo female who is a current smoker who presents to the clinic with cough, SOB, chest tightness for one month. Her husband got sick about 1 month ago and she got it but cannot get rid of it. Denies any fever. She has a lot of sinus pressure and ear pain as well. She is taking mucinex, sudafed, delsym with little benefit. She continues to smoke. No CP.   .. Active Ambulatory Problems    Diagnosis Date Noted  . GERD (gastroesophageal reflux disease) 05/23/2014  . Heart attack ([place]) 05/23/2014  . CAD (coronary artery disease) 05/23/2014  . Depression 05/23/2014  . Diverticulitis 05/23/2014  . History of diverticulitis 05/25/2014  . Calculus of gallbladder 05/25/2014  . Cerumen impaction 10/23/2014  . Swelling of foot joint, left 01/10/2017  . Morbid obesity ([place]) 01/10/2017   Resolved Ambulatory Problems    Diagnosis Date Noted  . No Resolved Ambulatory Problems   Past Medical History:  Diagnosis Date  . Anxiety   . Coronary artery disease   . Depression   . GERD (gastroesophageal reflux disease)   . Heart attack ([place]) 2011  . Pneumonia 2011  . PONV (postoperative nausea and vomiting)        Review of Systems  All other systems reviewed and are negative.      Objective:   Physical Exam  Constitutional: She is oriented to person, place, and time. She appears well-developed and well-nourished.  HENT:  Head: Normocephalic and atraumatic.   TM's erythematous. Visible light reflex.  Tenderness over frontal sinuses to palpation.  Oropharynx erythematous. No tonsil swelling.    Neck: Normal range of motion. Neck supple.  Cardiovascular: Normal rate, regular rhythm and normal heart sounds.   Pulmonary/Chest:  Wheezing, rhonchi bilateral lungs.   Lymphadenopathy:    She has cervical adenopathy.  Neurological: She is alert and oriented to person, place, and time.  Psychiatric: She has a normal mood and affect. Her behavior is normal.          Assessment & Plan:  Marland KitchenMarland KitchenRachele was seen today for cough, headache and otalgia.  Diagnoses and all orders for this visit:  Acute non-recurrent frontal sinusitis -     azithromycin (ZITHROMAX) 250 MG tablet; Take 2 tablets now and then one tablet for 4 days. -     predniSONE (DELTASONE) 50 MG tablet; Take one tablet for 5 days.  COPD exacerbation (HCC) -     albuterol (PROVENTIL HFA;VENTOLIN HFA) 108 (90 Base) MCG/ACT inhaler; Inhale 2 puffs into the lungs every 6 (six) hours as needed for wheezing or shortness of breath. -     azithromycin (ZITHROMAX) 250 MG tablet; Take 2 tablets now and then one tablet for 4 days. -     predniSONE (DELTASONE) 50 MG tablet; Take one tablet for 5 days. -     ipratropium-albuterol (DUONEB) 0.5-2.5 (3) MG/3ML nebulizer solution 3 mL; Take 3 mLs by nebulization once. -     hydrochlorothiazide (HYDRODIURIL) 12.5 MG tablet; Take 1 tablet (12.5 mg total) by mouth daily. As needed for left leg swelling. -     promethazine-codeine (PHENERGAN WITH CODEINE) 6.25-10 MG/5ML syrup; Take 5 mLs by mouth at bedtime.   codiene was given for cough at bedtime.  [place] controlled substance database reviewed with no concerns.  duoneb given in office with improvement.  Treated for  COPD and sinusitis.  Symptomatic care discussed.  Follow up as needed.

## 2017-02-03 NOTE — Patient Instructions (Signed)

## 2017-03-04 ENCOUNTER — Other Ambulatory Visit: Payer: Self-pay | Admitting: Physician Assistant

## 2017-03-05 ENCOUNTER — Telehealth: Payer: Self-pay | Admitting: Physician Assistant

## 2017-03-05 NOTE — Telephone Encounter (Signed)
I called pt and left message stating that she is due for a f/u appt with [facility] and it for depression.

## 2017-04-02 ENCOUNTER — Ambulatory Visit (INDEPENDENT_AMBULATORY_CARE_PROVIDER_SITE_OTHER): Payer: 59

## 2017-04-02 DIAGNOSIS — Z1231 Encounter for screening mammogram for malignant neoplasm of breast: Secondary | ICD-10-CM

## 2017-04-02 DIAGNOSIS — R928 Other abnormal and inconclusive findings on diagnostic imaging of breast: Secondary | ICD-10-CM | POA: Diagnosis not present

## 2017-04-02 DIAGNOSIS — Z0000 Encounter for general adult medical examination without abnormal findings: Secondary | ICD-10-CM

## 2017-04-13 ENCOUNTER — Other Ambulatory Visit: Payer: Self-pay | Admitting: Physician Assistant

## 2017-04-14 ENCOUNTER — Other Ambulatory Visit: Payer: Self-pay | Admitting: Physician Assistant

## 2017-04-14 DIAGNOSIS — R928 Other abnormal and inconclusive findings on diagnostic imaging of breast: Secondary | ICD-10-CM

## 2017-04-19 ENCOUNTER — Encounter: Payer: Self-pay | Admitting: Physician Assistant

## 2017-04-19 ENCOUNTER — Ambulatory Visit
Admission: RE | Admit: 2017-04-19 | Discharge: 2017-04-19 | Disposition: A | Discharge status: Home / Self Care | Payer: 59 | Source: Ambulatory Visit | Attending: Physician Assistant | Admitting: Physician Assistant

## 2017-04-19 ENCOUNTER — Other Ambulatory Visit: Payer: Self-pay

## 2017-04-19 ENCOUNTER — Other Ambulatory Visit: Payer: Self-pay | Admitting: Physician Assistant

## 2017-04-19 DIAGNOSIS — N6320 Unspecified lump in the left breast, unspecified quadrant: Secondary | ICD-10-CM

## 2017-04-19 DIAGNOSIS — R928 Other abnormal and inconclusive findings on diagnostic imaging of breast: Secondary | ICD-10-CM

## 2017-04-22 ENCOUNTER — Ambulatory Visit
Admission: RE | Admit: 2017-04-22 | Discharge: 2017-04-22 | Disposition: A | Discharge status: Home / Self Care | Payer: 59 | Source: Ambulatory Visit | Attending: Physician Assistant | Admitting: Physician Assistant

## 2017-04-22 ENCOUNTER — Other Ambulatory Visit: Payer: Self-pay | Admitting: Physician Assistant

## 2017-04-22 DIAGNOSIS — N6320 Unspecified lump in the left breast, unspecified quadrant: Secondary | ICD-10-CM

## 2017-05-01 ENCOUNTER — Other Ambulatory Visit: Payer: Self-pay | Admitting: Physician Assistant

## 2017-05-04 ENCOUNTER — Other Ambulatory Visit (HOSPITAL_COMMUNITY)
Admission: RE | Admit: 2017-05-04 | Discharge: 2017-05-04 | Disposition: A | Discharge status: Home / Self Care | Payer: 59 | Source: Ambulatory Visit | Attending: Physician Assistant | Admitting: Physician Assistant

## 2017-05-04 ENCOUNTER — Ambulatory Visit (INDEPENDENT_AMBULATORY_CARE_PROVIDER_SITE_OTHER): Payer: 59 | Admitting: Physician Assistant

## 2017-05-04 ENCOUNTER — Other Ambulatory Visit: Payer: Self-pay

## 2017-05-04 ENCOUNTER — Encounter: Payer: Self-pay | Admitting: Physician Assistant

## 2017-05-04 VITALS — BP 98/65 | HR 85 | Temp 98.7°F | Wt 244.0 lb

## 2017-05-04 DIAGNOSIS — Z720 Tobacco use: Secondary | ICD-10-CM | POA: Diagnosis not present

## 2017-05-04 DIAGNOSIS — I2510 Atherosclerotic heart disease of native coronary artery without angina pectoris: Secondary | ICD-10-CM

## 2017-05-04 DIAGNOSIS — Z23 Encounter for immunization: Secondary | ICD-10-CM | POA: Diagnosis not present

## 2017-05-04 DIAGNOSIS — F330 Major depressive disorder, recurrent, mild: Secondary | ICD-10-CM | POA: Diagnosis not present

## 2017-05-04 DIAGNOSIS — Z01419 Encounter for gynecological examination (general) (routine) without abnormal findings: Secondary | ICD-10-CM | POA: Diagnosis not present

## 2017-05-04 MED ORDER — VILAZODONE HCL 20 MG PO TABS: 1.0000 | ORAL_TABLET | Freq: Every day | ORAL | 0 refills | Status: DC

## 2017-05-04 MED ORDER — SIMVASTATIN 20 MG PO TABS: 20.0000 mg | ORAL_TABLET | Freq: Every day | ORAL | 1 refills | Status: DC

## 2017-05-04 MED ORDER — VILAZODONE HCL 40 MG PO TABS: 40.0000 mg | ORAL_TABLET | Freq: Every day | ORAL | 1 refills | Status: DC

## 2017-05-04 NOTE — Progress Notes (Signed)
Subjective:     Nina Taylor is a 61 y.o. woman who comes in today for a  pap smear only. Her most recent annual exam was on 01/08/17. Her most recent Pap smear was on unknown 4-5 years ago and showed no abnormalities. Previous abnormal Pap smears: no. Contraception: post menopausal status   Continues to smoke has not started chantix.   She does feel like viibryd is helping with her depression but recently it has worsened. She would like to increase her medication.   The following portions of the patient's history were reviewed and updated as appropriate: allergies, current medications, past family history, past medical history, past social history, past surgical history and problem list.  Review of Systems A comprehensive review of systems was negative.   Objective:    BP 98/65   Pulse 85   Temp 98.7 F (37.1 C) (Oral)   Wt 244 lb (110.7 kg)   BMI 37.10 kg/m  Pelvic Exam: cervix normal in appearance, external genitalia normal and vagina normal without discharge. Pap smear obtained.   Assessment:    Screening pap smear.   Plan:   Marland KitchenMarland KitchenCheray was seen today for gynecologic exam.  Diagnoses and all orders for this visit:  Encounter for gynecological examination without abnormal finding -     Cytology - PAP  Morbid obesity ([place])  Tobacco abuse  Mild episode of recurrent major depressive disorder (HCC) -     Discontinue: Vilazodone HCl 20 MG TABS; Take 1 tablet (20 mg total) by mouth daily.  Coronary artery disease involving native coronary artery of native heart without angina pectoris -     simvastatin (ZOCOR) 20 MG tablet; Take 1 tablet (20 mg total) by mouth daily.  Need for immunization against influenza -     Flu Vaccine QUAD 36+ mos IM  Other orders -     Cancel: Ambulatory referral to Gastroenterology  .Marland Kitchen Depression screen PHQ 2/9 01/08/2017  Decreased Interest 3  Down, Depressed, Hopeless 2  PHQ - 2 Score 5  Altered sleeping 2  Tired, decreased energy 2   Change in appetite 2  Feeling bad or failure about yourself  1  Trouble concentrating 3  Moving slowly or fidgety/restless 3  Suicidal thoughts 0  PHQ-9 Score 18    Mammogram is up to date and had biopsy on left breast mass which was benign.  Information on cologuard given. Pt declines colonoscopy .Marland KitchenDiscussed low carb diet with 1500 calories and 80g of protein.  Exercising at least 150 minutes a week.  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  Discussed medications. Will try diet and exercise first.   Increased viibryd to 40 mg. Follow up in 3 months.    PAP done today. If normal last one due to not needing after 65. Marland Kitchen

## 2017-05-06 LAB — CYTOLOGY - PAP
Diagnosis: NEGATIVE
HPV: NOT DETECTED

## 2017-05-18 ENCOUNTER — Telehealth: Payer: Self-pay

## 2017-05-18 ENCOUNTER — Ambulatory Visit (INDEPENDENT_AMBULATORY_CARE_PROVIDER_SITE_OTHER): Payer: 59 | Admitting: Physician Assistant

## 2017-05-18 ENCOUNTER — Encounter: Payer: Self-pay | Admitting: Physician Assistant

## 2017-05-18 VITALS — BP 114/72 | HR 79 | Wt 249.0 lb

## 2017-05-18 DIAGNOSIS — R7301 Impaired fasting glucose: Secondary | ICD-10-CM

## 2017-05-18 DIAGNOSIS — K2100 Gastro-esophageal reflux disease with esophagitis, without bleeding: Secondary | ICD-10-CM

## 2017-05-18 DIAGNOSIS — I2510 Atherosclerotic heart disease of native coronary artery without angina pectoris: Secondary | ICD-10-CM

## 2017-05-18 DIAGNOSIS — I10 Essential (primary) hypertension: Secondary | ICD-10-CM | POA: Diagnosis not present

## 2017-05-18 DIAGNOSIS — F330 Major depressive disorder, recurrent, mild: Secondary | ICD-10-CM

## 2017-05-18 MED ORDER — DEXLANSOPRAZOLE 60 MG PO CPDR: 60.0000 mg | DELAYED_RELEASE_CAPSULE | Freq: Every day | ORAL | 1 refills | Status: DC

## 2017-05-18 MED ORDER — VILAZODONE HCL 40 MG PO TABS: 40.0000 mg | ORAL_TABLET | Freq: Every day | ORAL | 1 refills | Status: DC

## 2017-05-18 MED ORDER — HYDROCHLOROTHIAZIDE 12.5 MG PO TABS: 12.5000 mg | ORAL_TABLET | Freq: Every day | ORAL | 1 refills | Status: DC

## 2017-05-18 MED ORDER — SIMVASTATIN 20 MG PO TABS: 20.0000 mg | ORAL_TABLET | Freq: Every day | ORAL | 1 refills | Status: DC

## 2017-05-18 NOTE — Telephone Encounter (Signed)
Pt spoke to insurance they cover the medication Saxenda Please send a script in for the medicine. Thank you!

## 2017-05-18 NOTE — Progress Notes (Signed)
   Subjective:    Patient ID: Nina Taylor, female    DOB: [birth year], 61 y.o.   MRN: [id number]  HPI Pt is a 61 yo female who presents to the clinic in need for refills.   She is going to be losing her insurance for 60 days. She has no concerns or complaints.   .. Active Ambulatory Problems    Diagnosis Date Noted  . GERD (gastroesophageal reflux disease) 05/23/2014  . Heart attack ([place]) 05/23/2014  . CAD (coronary artery disease) 05/23/2014  . Depression 05/23/2014  . Diverticulitis 05/23/2014  . History of diverticulitis 05/25/2014  . Calculus of gallbladder 05/25/2014  . Cerumen impaction 10/23/2014  . Swelling of foot joint, left 01/10/2017  . Morbid obesity ([place]) 01/10/2017  . Left breast mass 04/19/2017  . Tobacco abuse 05/04/2017  . Essential hypertension 05/18/2017  . Elevated fasting glucose 05/18/2017   Resolved Ambulatory Problems    Diagnosis Date Noted  . No Resolved Ambulatory Problems   Past Medical History:  Diagnosis Date  . Anxiety   . Coronary artery disease   . Depression   . GERD (gastroesophageal reflux disease)   . Heart attack ([place]) 2011  . Pneumonia 2011  . PONV (postoperative nausea and vomiting)       Review of Systems  All other systems reviewed and are negative.      Objective:   Physical Exam  Constitutional: She is oriented to person, place, and time. She appears well-developed and well-nourished.  HENT:  Head: Normocephalic and atraumatic.  Cardiovascular: Normal rate, regular rhythm and normal heart sounds.   Pulmonary/Chest: Effort normal and breath sounds normal.  Neurological: She is alert and oriented to person, place, and time.  Psychiatric: She has a normal mood and affect. Her behavior is normal.          Assessment & Plan:  Marland KitchenMarland KitchenIkeya was seen today for medication management.  Diagnoses and all orders for this visit:  Elevated fasting glucose -     Hemoglobin A1c  Coronary artery disease involving native  coronary artery of native heart without angina pectoris -     simvastatin (ZOCOR) 20 MG tablet; Take 1 tablet (20 mg total) by mouth daily. -     Lipid Panel w/reflex Direct LDL  Gastroesophageal reflux disease with esophagitis -     dexlansoprazole (DEXILANT) 60 MG capsule; Take 1 capsule (60 mg total) by mouth daily.  Essential hypertension -     hydrochlorothiazide (HYDRODIURIL) 12.5 MG tablet; Take 1 tablet (12.5 mg total) by mouth daily. As needed for left leg swelling.  Mild episode of recurrent major depressive disorder (HCC) -     Vilazodone HCl (VIIBRYD) 40 MG TABS; Take 1 tablet (40 mg total) by mouth daily.  .. Depression screen [facility] 2/9 05/18/2017 01/08/2017  Decreased Interest 0 3  Down, Depressed, Hopeless 0 2  PHQ - 2 Score 0 5  Altered sleeping 0 2  Tired, decreased energy 1 2  Change in appetite 0 2  Feeling bad or failure about yourself  0 1  Trouble concentrating 0 3  Moving slowly or fidgety/restless 0 3  Suicidal thoughts 0 0  PHQ-9 Score 1 18  Difficult doing work/chores Not difficult at all -     Refilled for 3 months.   Will get labs to evaluate since last labs patient was not fasting.

## 2017-05-18 NOTE — Telephone Encounter (Signed)
Ok will send

## 2017-05-31 ENCOUNTER — Other Ambulatory Visit: Payer: Self-pay | Admitting: Physician Assistant

## 2017-06-04 ENCOUNTER — Other Ambulatory Visit: Payer: Self-pay | Admitting: Physician Assistant

## 2017-06-04 DIAGNOSIS — F330 Major depressive disorder, recurrent, mild: Secondary | ICD-10-CM

## 2017-06-04 MED ORDER — LIRAGLUTIDE -WEIGHT MANAGEMENT 18 MG/3ML ~~LOC~~ SOPN: 0.6000 mg | PEN_INJECTOR | Freq: Every day | SUBCUTANEOUS | 1 refills | Status: DC

## 2017-06-04 MED ORDER — VILAZODONE HCL 40 MG PO TABS: 40.0000 mg | ORAL_TABLET | Freq: Every day | ORAL | 1 refills | Status: DC

## 2017-06-04 NOTE — Progress Notes (Signed)
Sent refill of saxenda and viibryd due to insurance running out.

## 2017-06-22 ENCOUNTER — Encounter: Payer: Self-pay | Admitting: Physician Assistant

## 2017-07-27 ENCOUNTER — Encounter: Payer: Self-pay | Admitting: Physician Assistant

## 2017-07-28 ENCOUNTER — Other Ambulatory Visit: Payer: Self-pay | Admitting: Physician Assistant

## 2017-07-28 MED ORDER — SERTRALINE HCL 100 MG PO TABS: 200.0000 mg | ORAL_TABLET | Freq: Every day | ORAL | 1 refills | Status: DC

## 2017-07-28 NOTE — Progress Notes (Signed)
Lost insurance. Needs to go back to zoloft from viibryd while switching jobs.

## 2017-11-05 ENCOUNTER — Other Ambulatory Visit: Payer: Self-pay | Admitting: *Deleted

## 2017-11-05 DIAGNOSIS — F330 Major depressive disorder, recurrent, mild: Secondary | ICD-10-CM

## 2017-11-05 MED ORDER — VILAZODONE HCL 40 MG PO TABS: 40.0000 mg | ORAL_TABLET | Freq: Every day | ORAL | 1 refills | Status: DC

## 2017-11-22 ENCOUNTER — Ambulatory Visit: Payer: BLUE CROSS/BLUE SHIELD | Admitting: Physician Assistant

## 2017-11-22 ENCOUNTER — Encounter: Payer: Self-pay | Admitting: Physician Assistant

## 2017-11-22 VITALS — BP 112/62 | HR 90 | Temp 98.2°F | Ht 68.0 in | Wt 243.0 lb

## 2017-11-22 DIAGNOSIS — J069 Acute upper respiratory infection, unspecified: Secondary | ICD-10-CM

## 2017-11-22 DIAGNOSIS — F419 Anxiety disorder, unspecified: Secondary | ICD-10-CM | POA: Diagnosis not present

## 2017-11-22 DIAGNOSIS — F330 Major depressive disorder, recurrent, mild: Secondary | ICD-10-CM

## 2017-11-22 DIAGNOSIS — B9789 Other viral agents as the cause of diseases classified elsewhere: Secondary | ICD-10-CM | POA: Diagnosis not present

## 2017-11-22 DIAGNOSIS — I10 Essential (primary) hypertension: Secondary | ICD-10-CM

## 2017-11-22 MED ORDER — IPRATROPIUM BROMIDE 0.06 % NA SOLN: 2.0000 | Freq: Four times a day (QID) | NASAL | 0 refills | Status: DC

## 2017-11-22 NOTE — Patient Instructions (Signed)
atrovent 4 times a day.  Continue mucinex and sudafed.  Get a humidifer.    Upper Respiratory Infection, Adult Most upper respiratory infections (URIs) are caused by a virus. A URI affects the nose, throat, and upper air passages. The most common type of URI is often called "the common cold." Follow these instructions at home:  Take medicines only as told by your doctor.  Gargle warm saltwater or take cough drops to comfort your throat as told by your doctor.  Use a warm mist humidifier or inhale steam from a shower to increase air moisture. This may make it easier to breathe.  Drink enough fluid to keep your pee (urine) clear or pale yellow.  Eat soups and other clear broths.  Have a healthy diet.  Rest as needed.  Go back to work when your fever is gone or your doctor says it is okay. ? You may need to stay home longer to avoid giving your URI to others. ? You can also wear a face mask and wash your hands often to prevent spread of the virus.  Use your inhaler more if you have asthma.  Do not use any tobacco products, including cigarettes, chewing tobacco, or electronic cigarettes. If you need help quitting, ask your doctor. Contact a doctor if:  You are getting worse, not better.  Your symptoms are not helped by medicine.  You have chills.  You are getting more short of breath.  You have brown or red mucus.  You have yellow or brown discharge from your nose.  You have pain in your face, especially when you bend forward.  You have a fever.  You have puffy (swollen) neck glands.  You have pain while swallowing.  You have white areas in the back of your throat. Get help right away if:  You have very bad or constant: ? Headache. ? Ear pain. ? Pain in your forehead, behind your eyes, and over your cheekbones (sinus pain). ? Chest pain.  You have long-lasting (chronic) lung disease and any of the following: ? Wheezing. ? Long-lasting cough. ? Coughing up  blood. ? A change in your usual mucus.  You have a stiff neck.  You have changes in your: ? Vision. ? Hearing. ? Thinking. ? Mood. This information is not intended to replace advice given to you by your health care provider. Make sure you discuss any questions you have with your health care provider. Document Released: 02/10/2008 Document Revised: 04/26/2016 Document Reviewed: 11/29/2013 Elsevier Interactive Patient Education  2018 [employer].

## 2017-11-22 NOTE — Progress Notes (Signed)
Subjective:    Patient ID: Nina Taylor, female    DOB: [birth year], 63 y.o.   MRN: [id number]  HPI Pt is a 62 yo female who presents to the clinic with 4 days of sinus pressure, nasal congestion, dry cough, headache, ST, ear popping. She has not really tried anything OTC. Most of her symptoms are pressure around her nose. She cannot sleep at night due to not being able to breath out of her nose. No fever, chills, body aches. No SOB, wheezing.   Pt needs PA on viibryd. zoloft is not working. viibryd worked Engineer, manufacturing.   .. Active Ambulatory Problems    Diagnosis Date Noted  . GERD (gastroesophageal reflux disease) 05/23/2014  . Heart attack (Nina Taylor) 05/23/2014  . CAD (coronary artery disease) 05/23/2014  . Depression 05/23/2014  . Diverticulitis 05/23/2014  . History of diverticulitis 05/25/2014  . Calculus of gallbladder 05/25/2014  . Cerumen impaction 10/23/2014  . Swelling of foot joint, left 01/10/2017  . Morbid obesity (Nina Taylor) 01/10/2017  . Left breast mass 04/19/2017  . Tobacco abuse 05/04/2017  . Essential hypertension 05/18/2017  . Elevated fasting glucose 05/18/2017  . Anxiety 11/23/2017   Resolved Ambulatory Problems    Diagnosis Date Noted  . No Resolved Ambulatory Problems   Past Medical History:  Diagnosis Date  . Anxiety   . Coronary artery disease   . Depression   . GERD (gastroesophageal reflux disease)   . Heart attack (Nina Taylor) 2011  . Pneumonia 2011  . PONV (postoperative nausea and vomiting)     Review of Systems  All other systems reviewed and are negative.      Objective:   Physical Exam  Constitutional: She is oriented to person, place, and time. She appears well-developed and well-nourished.  HENT:  Head: Normocephalic and atraumatic.  Right Ear: External ear normal.  Left Ear: External ear normal.  Oropharynx erythematous without PND.  Nasal turbinates red and swollen.  Tenderness over sinuses maxillary and frontal.   Eyes: Conjunctivae are  normal. Right eye exhibits no discharge. Left eye exhibits no discharge.  Neck: Normal range of motion. Neck supple.  Cardiovascular: Normal rate, regular rhythm and normal heart sounds.  Pulmonary/Chest: Effort normal and breath sounds normal. She has no wheezes.  Lymphadenopathy:    She has no cervical adenopathy.  Neurological: She is alert and oriented to person, place, and time.  Skin: Skin is dry.  Psychiatric: She has a normal mood and affect. Her behavior is normal.          Assessment & Plan:  Nina Taylor KitchenMarland KitchenGeorjean was seen today for headache, sore throat and otalgia.  Diagnoses and all orders for this visit:  Viral URI with cough -     ipratropium (ATROVENT) 0.06 % nasal spray; Place 2 sprays into both nostrils 4 (four) times daily.  Essential hypertension  Mild episode of recurrent major depressive disorder (Nina Taylor)  Anxiety   .Nina Taylor Kitchen Depression screen Nina Taylor 2/9 05/18/2017 01/08/2017  Decreased Interest 0 3  Down, Depressed, Hopeless 0 2  PHQ - 2 Score 0 5  Altered sleeping 0 2  Tired, decreased energy 1 2  Change in appetite 0 2  Feeling bad or failure about yourself  0 1  Trouble concentrating 0 3  Moving slowly or fidgety/restless 0 3  Suicidal thoughts 0 0  PHQ-9 Score 1 18  Difficult doing work/chores Not difficult at all -    Discussed seems viral. Discussed symptomatic care. Given atrovent to start. Rest and hydrate. Get humidifier.  If not improving let me know. Discussed timeline of 1 week for improvement of most viral infections.   Looking for PA for viibryd. Pt has tolerated very well and works. Currently on zoloft with little benefit.

## 2017-11-23 DIAGNOSIS — F419 Anxiety disorder, unspecified: Secondary | ICD-10-CM | POA: Insufficient documentation

## 2017-12-01 ENCOUNTER — Other Ambulatory Visit: Payer: Self-pay

## 2017-12-01 ENCOUNTER — Telehealth: Payer: Self-pay | Admitting: Physician Assistant

## 2017-12-01 NOTE — Telephone Encounter (Signed)
Received fax from [place] that Nina Taylor was approved from 12/01/2017 until 12/01/2018. Pharmacy notified and forms sent to scan.   Reference ID: [id number].

## 2018-03-15 ENCOUNTER — Ambulatory Visit (INDEPENDENT_AMBULATORY_CARE_PROVIDER_SITE_OTHER): Payer: BLUE CROSS/BLUE SHIELD | Admitting: Physician Assistant

## 2018-03-15 ENCOUNTER — Encounter: Payer: Self-pay | Admitting: Physician Assistant

## 2018-03-15 VITALS — BP 131/82 | HR 86 | Ht 67.99 in | Wt 247.0 lb

## 2018-03-15 DIAGNOSIS — Z8719 Personal history of other diseases of the digestive system: Secondary | ICD-10-CM | POA: Diagnosis not present

## 2018-03-15 DIAGNOSIS — R7301 Impaired fasting glucose: Secondary | ICD-10-CM

## 2018-03-15 DIAGNOSIS — F17200 Nicotine dependence, unspecified, uncomplicated: Secondary | ICD-10-CM | POA: Diagnosis not present

## 2018-03-15 DIAGNOSIS — I10 Essential (primary) hypertension: Secondary | ICD-10-CM

## 2018-03-15 DIAGNOSIS — Z0000 Encounter for general adult medical examination without abnormal findings: Secondary | ICD-10-CM

## 2018-03-15 DIAGNOSIS — K582 Mixed irritable bowel syndrome: Secondary | ICD-10-CM

## 2018-03-15 DIAGNOSIS — Z6837 Body mass index (BMI) 37.0-37.9, adult: Secondary | ICD-10-CM

## 2018-03-15 DIAGNOSIS — E6609 Other obesity due to excess calories: Secondary | ICD-10-CM | POA: Insufficient documentation

## 2018-03-15 DIAGNOSIS — K2100 Gastro-esophageal reflux disease with esophagitis, without bleeding: Secondary | ICD-10-CM

## 2018-03-15 DIAGNOSIS — I2510 Atherosclerotic heart disease of native coronary artery without angina pectoris: Secondary | ICD-10-CM | POA: Diagnosis not present

## 2018-03-15 DIAGNOSIS — Z1159 Encounter for screening for other viral diseases: Secondary | ICD-10-CM | POA: Diagnosis not present

## 2018-03-15 DIAGNOSIS — R5383 Other fatigue: Secondary | ICD-10-CM | POA: Diagnosis not present

## 2018-03-15 DIAGNOSIS — Z131 Encounter for screening for diabetes mellitus: Secondary | ICD-10-CM | POA: Diagnosis not present

## 2018-03-15 DIAGNOSIS — Z6835 Body mass index (BMI) 35.0-35.9, adult: Secondary | ICD-10-CM

## 2018-03-15 MED ORDER — RIFAXIMIN 550 MG PO TABS: 550.0000 mg | ORAL_TABLET | Freq: Three times a day (TID) | ORAL | 0 refills | Status: DC

## 2018-03-15 MED ORDER — HYDROCHLOROTHIAZIDE 12.5 MG PO TABS
12.5000 mg | ORAL_TABLET | Freq: Every day | ORAL | 1 refills | Status: DC
Start: 2018-03-15 — End: 2018-12-20

## 2018-03-15 MED ORDER — SIMVASTATIN 20 MG PO TABS: 20.0000 mg | ORAL_TABLET | Freq: Every day | ORAL | 3 refills | Status: DC

## 2018-03-15 MED ORDER — BUPROPION HCL ER (XL) 150 MG PO TB24: 150.0000 mg | ORAL_TABLET | ORAL | 1 refills | Status: DC

## 2018-03-15 NOTE — Progress Notes (Signed)
Subjective:     Nina Taylor is a 62 y.o. female and is here for a comprehensive physical exam. The patient reports problems - see below. .  Pt feels like her mood is worsening. She was doing great of viibryd for a while. It is very expensive 200 dollars a month but also her GI issues are not controlled. She is having a lot of trouble concentrating and with energy.  She feels gassy and bloated all the time. She feels like she cannot eat dairy, vegetables, bread. The only thing that does not mess her stomach up is meat. Hx of diverticulitis. She denies any melena or hematochezia. She does have mixed constipation/diarrhea.   She admits to running out of cholesterol medication.   Denies any hx of seizures.   Social History   Socioeconomic History  . Marital status: Married    Spouse name: Not on file  . Number of children: Not on file  . Years of education: Not on file  . Highest education level: Not on file  Occupational History  . Not on file  Social Needs  . Financial resource strain: Not on file  . Food insecurity:    Worry: Not on file    Inability: Not on file  . Transportation needs:    Medical: Not on file    Non-medical: Not on file  Tobacco Use  . Smoking status: Current Every Day Smoker    Packs/day: 0.25    Years: 20.00    Pack years: 5.00    Types: Cigarettes    Start date: 09/10/1993  . Smokeless tobacco: Never Used  Substance and Sexual Activity  . Alcohol use: Yes    Alcohol/week: 0.0 oz    Comment: wine rarely  . Drug use: No  . Sexual activity: Not Currently  Lifestyle  . Physical activity:    Days per week: Not on file    Minutes per session: Not on file  . Stress: Not on file  Relationships  . Social connections:    Talks on phone: Not on file    Gets together: Not on file    Attends religious service: Not on file    Active member of club or organization: Not on file    Attends meetings of clubs or organizations: Not on file    Relationship  status: Not on file  . Intimate partner violence:    Fear of current or ex partner: Not on file    Emotionally abused: Not on file    Physically abused: Not on file    Forced sexual activity: Not on file  Other Topics Concern  . Not on file  Social History Narrative  . Not on file   Health Maintenance  Topic Date Due  . Fecal DNA (Cologuard)  09/10/2005  . HIV Screening  03/16/2019 (Originally 09/10/1970)  . MAMMOGRAM  04/02/2018  . INFLUENZA VACCINE  04/07/2018  . PAP SMEAR  05/04/2020  . TETANUS/TDAP  01/09/2027  . Hepatitis C Screening  Completed    The following portions of the patient's history were reviewed and updated as appropriate: allergies, current medications, past family history, past medical history, past social history, past surgical history and problem list.  Review of Systems Pertinent items noted in HPI and remainder of comprehensive ROS otherwise negative.   Objective:    BP 131/82   Pulse 86   Ht 5' 7.99" (1.727 m)   Wt 247 lb (112 kg)   SpO2 97%   BMI  37.57 kg/m  General appearance: alert, cooperative and appears stated age Head: Normocephalic, without obvious abnormality, atraumatic Eyes: conjunctivae/corneas clear. PERRL, EOM's intact. Fundi benign. Ears: normal TM's and external ear canals both ears Nose: Nares normal. Septum midline. Mucosa normal. No drainage or sinus tenderness. Throat: lips, mucosa, and tongue normal; teeth and gums normal Neck: no adenopathy, no carotid bruit, no JVD, supple, symmetrical, trachea midline and thyroid not enlarged, symmetric, no tenderness/mass/nodules Back: symmetric, no curvature. ROM normal. No CVA tenderness. Lungs: clear to auscultation bilaterally Heart: regular rate and rhythm, S1, S2 normal, no murmur, click, rub or gallop Abdomen: soft, non-tender; bowel sounds normal; no masses,  no organomegaly Extremities: extremities normal, atraumatic, no cyanosis or edema Pulses: 2+ and symmetric Skin: Skin  color, texture, turgor normal. No rashes or lesions Lymph nodes: Cervical, supraclavicular, and axillary nodes normal. Neurologic: Alert and oriented X 3, normal strength and tone. Normal symmetric reflexes. Normal coordination and gait    Assessment:    Healthy female exam.      Plan:      Marland KitchenMarland KitchenOniyah was seen today for annual exam and abdominal pain.  Diagnoses and all orders for this visit:  Routine physical examination -     Lipid Panel w/reflex Direct LDL -     COMPLETE METABOLIC PANEL WITH GFR -     Hepatitis C Antibody -     Hemoglobin A1c -     TSH -     B12 and Folate Panel -     VITAMIN D 25 Hydroxy (Vit-D Deficiency, Fractures) -     CBC with Differential/Platelet  Essential hypertension -     hydrochlorothiazide (HYDRODIURIL) 12.5 MG tablet; Take 1 tablet (12.5 mg total) by mouth daily. As needed for left leg swelling.  Coronary artery disease involving native coronary artery of native heart without angina pectoris -     simvastatin (ZOCOR) 20 MG tablet; Take 1 tablet (20 mg total) by mouth daily.  Gastroesophageal reflux disease with esophagitis  History of diverticulitis  Elevated fasting glucose -     COMPLETE METABOLIC PANEL WITH GFR -     Hemoglobin A1c  Class 2 severe obesity due to excess calories with serious comorbidity and body mass index (BMI) of 37.0 to 37.9 in adult [facility])  Screening for diabetes mellitus -     COMPLETE METABOLIC PANEL WITH GFR  Need for hepatitis C screening test -     Hepatitis C Antibody  No energy -     TSH -     B12 and Folate Panel -     VITAMIN D 25 Hydroxy (Vit-D Deficiency, Fractures) -     CBC with Differential/Platelet  Other orders -     rifaximin (XIFAXAN) 550 MG TABS tablet; Take 1 tablet (550 mg total) by mouth 3 (three) times daily. For 14 days for IBS. -     buPROPion (WELLBUTRIN XL) 150 MG 24 hr tablet; Take 1 tablet (150 mg total) by mouth every morning.   .. Depression screen [facility] 2/9 03/15/2018  05/18/2017 01/08/2017  Decreased Interest 1 0 3  Down, Depressed, Hopeless 1 0 2  PHQ - 2 Score 2 0 5  Altered sleeping 1 0 2  Tired, decreased energy 1 1 2   Change in appetite 3 0 2  Feeling bad or failure about yourself  0 0 1  Trouble concentrating 1 0 3  Moving slowly or fidgety/restless 2 0 3  Suicidal thoughts 0 0 0  PHQ-9 Score 10  1 18  Difficult doing work/chores Somewhat difficult Not difficult at all -   .. GAD 7 : Generalized Anxiety Score 03/15/2018  Nervous, Anxious, on Edge 1  Control/stop worrying 1  Worry too much - different things 1  Trouble relaxing 2  Restless 2  Easily annoyed or irritable 1  Afraid - awful might happen 1  Total GAD 7 Score 9  Anxiety Difficulty Somewhat difficult     Discussed 150 minutes of exercise a week.  Encouraged vitamin D 1000 units and Calcium 1300mg  or 4 servings of dairy a day.  cologuard refaxed to have recollected again.  Mammogram will call for yearly screening hx of abnormal mammograms and need for follow up.  Pap up to date. Declines pelvic exam today.   Marland Kitchen.Discussed low carb diet with 1500 calories and 80g of protein.  Exercising at least 150 minutes a week.  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  Cost of medications are an issue. Starting wellbutrin for mood and weight.   Her depression and anxiety have worsened. Discussed exercise/medication. She feels bad and that is why she thinks her mood has worsened. She has no energy, GI upset all the time. She done really well on viibryd. Stay on for now. Added wellbutrin. Labs to evaluate no energy. Start xifaxin to reset IBS symptoms. Follow up in 4-6 weeks.   Pt not interested in smoking cessation. Discussed lung cancer screening. Will have nurse to call back not sure if she wanted to proceed or hold off. She would like to proceed.  See After Visit Summary for Counseling Recommendations

## 2018-03-15 NOTE — Patient Instructions (Signed)

## 2018-03-16 LAB — COMPLETE METABOLIC PANEL WITH GFR
AG Ratio: 1.4 (calc) (ref 1.0–2.5)
ALKALINE PHOSPHATASE (APISO): 99 U/L (ref 33–130)
ALT: 25 U/L (ref 6–29)
AST: 18 U/L (ref 10–35)
Albumin: 4.5 g/dL (ref 3.6–5.1)
BUN: 20 mg/dL (ref 7–25)
CALCIUM: 9.8 mg/dL (ref 8.6–10.4)
CO2: 28 mmol/L (ref 20–32)
CREATININE: 0.74 mg/dL (ref 0.50–0.99)
Chloride: 102 mmol/L (ref 98–110)
GFR, EST NON AFRICAN AMERICAN: 87 mL/min/{1.73_m2} (ref >=60)
GFR, Est African American: 101 mL/min/{1.73_m2} (ref >=60)
GLUCOSE: 100 mg/dL — AB (ref 65–99)
Globulin: 3.3 g/dL (calc) (ref 1.9–3.7)
Potassium: 4.3 mmol/L (ref 3.5–5.3)
Sodium: 138 mmol/L (ref 135–146)
Total Bilirubin: 0.3 mg/dL (ref 0.2–1.2)
Total Protein: 7.8 g/dL (ref 6.1–8.1)

## 2018-03-16 LAB — LIPID PANEL W/REFLEX DIRECT LDL
CHOL/HDL RATIO: 5.1 (calc) — AB (ref <5.0)
CHOLESTEROL: 225 mg/dL — AB (ref <200)
HDL: 44 mg/dL — AB (ref >50)
LDL CHOLESTEROL (CALC): 154 mg/dL — AB
NON-HDL CHOLESTEROL (CALC): 181 mg/dL — AB (ref <130)
Triglycerides: 141 mg/dL (ref <150)

## 2018-03-16 LAB — CBC WITH DIFFERENTIAL/PLATELET
BASOS PCT: 0.5 %
Basophils Absolute: 58 cells/uL (ref 0–200)
EOS PCT: 1.8 %
Eosinophils Absolute: 207 cells/uL (ref 15–500)
HCT: 43.6 % (ref 35.0–45.0)
Hemoglobin: 14.3 g/dL (ref 11.7–15.5)
Lymphs Abs: 3738 cells/uL (ref 850–3900)
MCH: 28.3 pg (ref 27.0–33.0)
MCHC: 32.8 g/dL (ref 32.0–36.0)
MCV: 86.2 fL (ref 80.0–100.0)
MONOS PCT: 6.9 %
MPV: 11.2 fL (ref 7.5–12.5)
NEUTROS PCT: 58.3 %
Neutro Abs: 6705 cells/uL (ref 1500–7800)
PLATELETS: 224 10*3/uL (ref 140–400)
RBC: 5.06 10*6/uL (ref 3.80–5.10)
RDW: 13.6 % (ref 11.0–15.0)
TOTAL LYMPHOCYTE: 32.5 %
WBC: 11.5 10*3/uL — ABNORMAL HIGH (ref 3.8–10.8)
WBCMIX: 794 {cells}/uL (ref 200–950)

## 2018-03-16 LAB — HEMOGLOBIN A1C
EAG (MMOL/L): 7.4 (calc)
HEMOGLOBIN A1C: 6.3 %{Hb} — AB (ref <5.7)
Mean Plasma Glucose: 134 (calc)

## 2018-03-16 LAB — TSH: TSH: 2.37 m[IU]/L (ref 0.40–4.50)

## 2018-03-16 LAB — HEPATITIS C ANTIBODY
Hepatitis C Ab: NONREACTIVE
SIGNAL TO CUT-OFF: 0.23 (ref <1.00)

## 2018-03-16 LAB — VITAMIN D 25 HYDROXY (VIT D DEFICIENCY, FRACTURES): VIT D 25 HYDROXY: 22 ng/mL — AB (ref 30–100)

## 2018-03-16 LAB — B12 AND FOLATE PANEL
FOLATE: 8.4 ng/mL
VITAMIN B 12: 302 pg/mL (ref 200–1100)

## 2018-03-17 ENCOUNTER — Encounter: Payer: Self-pay | Admitting: Physician Assistant

## 2018-03-17 NOTE — Progress Notes (Signed)
Call pt: your overall cardiovascular risk is pretty high. I know you mentioned not taking zocor. I need you to take this and recheck in 4-6 months to see if reducing LDL like it should. Do you have plenty of refills? If not will given 6 months worth.  Kidney and liver look good.  Hep C negative.  Thyroid good.  b12 low normal. Start b12 1053mcg daily.  Vitamin d is a little low as well. Start D3 1000units daily.   You are really close to diabetes. In the pre-diabetic range. Decreasing sugar, carbs and weight loss could help decrease this number. Recheck in 6 months.

## 2018-03-21 ENCOUNTER — Telehealth: Payer: Self-pay | Admitting: Physician Assistant

## 2018-03-21 DIAGNOSIS — R5383 Other fatigue: Secondary | ICD-10-CM | POA: Insufficient documentation

## 2018-03-21 NOTE — Telephone Encounter (Signed)
Received fax from [facility] that [place] was approved from 03/21/2018 through 04/04/2018.  Pharmacy notified and forms sent to scan.   Reference ID: [id number].

## 2018-03-21 NOTE — Telephone Encounter (Signed)
Called pt and she states that she is interested in this, would like orders to be placed.  Pt also notes that she has not yet heard from [employer]- but was seen less that 1 week ago. Will give more time to hear from company

## 2018-03-21 NOTE — Telephone Encounter (Signed)
Pt is smoker and older than 50 she would qualify for low dose CT lung cancer screening. Would she like this referral?

## 2018-03-21 NOTE — Telephone Encounter (Signed)
Ok I will place order.

## 2018-03-22 ENCOUNTER — Telehealth: Payer: Self-pay | Admitting: Physician Assistant

## 2018-03-22 MED ORDER — VARENICLINE TARTRATE 0.5 MG X 11 & 1 MG X 42 PO MISC: ORAL | 0 refills | Status: DC

## 2018-03-22 NOTE — Telephone Encounter (Signed)
Patient was notified about Jades recommendations and she voices understanding. She did not have any further questions. She will call back to set up an appointment.

## 2018-03-22 NOTE — Telephone Encounter (Signed)
Patient called and left a message that she was willing to try Chantix since it was covered by her insurance. Please send to [facility] on file.

## 2018-03-22 NOTE — Telephone Encounter (Signed)
Ok will send to pharmacy. Goal is to be stopped smoking in first month of use. Follow up in one month. Nausea could be a side effect. No longer black box warning for suicidal thoughts but can cause some mood changes coming off nicotine.

## 2018-04-22 ENCOUNTER — Telehealth: Payer: Self-pay

## 2018-04-22 MED ORDER — VARENICLINE TARTRATE 1 MG PO TABS: 1.0000 mg | ORAL_TABLET | Freq: Two times a day (BID) | ORAL | 1 refills | Status: DC

## 2018-04-22 NOTE — Telephone Encounter (Signed)
rx sent

## 2018-04-22 NOTE — Telephone Encounter (Signed)
Nina Taylor called and states she is doing well with the Chantix. She would like a continuing pak.

## 2018-06-05 ENCOUNTER — Other Ambulatory Visit: Payer: Self-pay | Admitting: Physician Assistant

## 2018-06-05 DIAGNOSIS — F330 Major depressive disorder, recurrent, mild: Secondary | ICD-10-CM

## 2018-07-08 ENCOUNTER — Ambulatory Visit (INDEPENDENT_AMBULATORY_CARE_PROVIDER_SITE_OTHER): Payer: BLUE CROSS/BLUE SHIELD | Admitting: Physician Assistant

## 2018-07-08 ENCOUNTER — Encounter: Payer: Self-pay | Admitting: Physician Assistant

## 2018-07-08 VITALS — BP 121/76 | HR 88 | Ht 67.99 in | Wt 231.0 lb

## 2018-07-08 DIAGNOSIS — I10 Essential (primary) hypertension: Secondary | ICD-10-CM

## 2018-07-08 DIAGNOSIS — R7301 Impaired fasting glucose: Secondary | ICD-10-CM

## 2018-07-08 DIAGNOSIS — F330 Major depressive disorder, recurrent, mild: Secondary | ICD-10-CM | POA: Diagnosis not present

## 2018-07-08 DIAGNOSIS — I2510 Atherosclerotic heart disease of native coronary artery without angina pectoris: Secondary | ICD-10-CM

## 2018-07-08 DIAGNOSIS — E6609 Other obesity due to excess calories: Secondary | ICD-10-CM

## 2018-07-08 DIAGNOSIS — Z6835 Body mass index (BMI) 35.0-35.9, adult: Secondary | ICD-10-CM

## 2018-07-08 DIAGNOSIS — Z1211 Encounter for screening for malignant neoplasm of colon: Secondary | ICD-10-CM

## 2018-07-08 NOTE — Progress Notes (Signed)
Subjective:    Patient ID: Nina Taylor, female    DOB: [birth year], 62 y.o.   MRN: [id number]  HPI  Pt is a 62 yo female with CAD, elevated fasting glucose, hypertension, depression who presents to the clinic for follow-up.  About 3 months ago patient had some lab work and she was prediabetic.  This is really cause her to focus on her health.  She is joining the next 56-day program and has signed up for another 56 days.  She has lost 16 pounds.  She is feeling so much better.  She is working out at least 5 days a week.  She has made a lot of diet changes watching what she eats and drinks.  She has some much more energy.  She has no problems with her mood at this time.  She did try Chantix but she had extremely bad dreams and cannot tolerate this medication.  She has cut back from 1 pack a day to 1 pack a week.  She plans on continuing to decrease this amount.  She does complain of some intermittent left lower quadrant pain with bowel movements.  She usually has a left lower quadrant twinge and that she has a bowel movement and it improves.  She is done nothing to make better.  She has noticed this for the last few months.  .. Active Ambulatory Problems    Diagnosis Date Noted  . GERD (gastroesophageal reflux disease) 05/23/2014  . Heart attack ([place]) 05/23/2014  . CAD (coronary artery disease) 05/23/2014  . Depression 05/23/2014  . Diverticulitis 05/23/2014  . History of diverticulitis 05/25/2014  . Calculus of gallbladder 05/25/2014  . Cerumen impaction 10/23/2014  . Swelling of foot joint, left 01/10/2017  . Left breast mass 04/19/2017  . Tobacco abuse 05/04/2017  . Essential hypertension 05/18/2017  . Elevated fasting glucose 05/18/2017  . Anxiety 11/23/2017  . Class 2 severe obesity due to excess calories with serious comorbidity and body mass index (BMI) of 37.0 to 37.9 in adult ([place]) 03/15/2018  . No energy 03/21/2018   Resolved Ambulatory Problems    Diagnosis Date Noted  .  Morbid obesity ([place]) 01/10/2017   Past Medical History:  Diagnosis Date  . Coronary artery disease   . Pneumonia 2011  . PONV (postoperative nausea and vomiting)     Review of Systems  All other systems reviewed and are negative.      Objective:   Physical Exam  Constitutional: She is oriented to person, place, and time. She appears well-developed and well-nourished.  HENT:  Head: Normocephalic and atraumatic.  Cardiovascular: Normal rate and regular rhythm.  Pulmonary/Chest: Effort normal and breath sounds normal.  Abdominal: Soft. Bowel sounds are normal. She exhibits no mass. There is no tenderness. There is no guarding.  Neurological: She is alert and oriented to person, place, and time.  Psychiatric: She has a normal mood and affect. Her behavior is normal.          Assessment & Plan:  Marland KitchenMarland KitchenDiagnoses and all orders for this visit:  Coronary artery disease involving native coronary artery of native heart without angina pectoris -     Lipid Panel w/reflex Direct LDL  Essential hypertension  Elevated fasting glucose -     Hemoglobin A1c  Mild episode of recurrent major depressive disorder (HCC)  Colon cancer screening -     Ambulatory referral to Gastroenterology  Class 2 obesity due to excess calories without serious comorbidity with body mass index (BMI) of  35.0 to 35.9 in adult    Results for orders placed or performed in visit on 07/08/18  Hemoglobin A1c  Result Value Ref Range   Hgb A1c MFr Bld 6.2 (H) <5.7 % of total Hgb   Mean Plasma Glucose 131 (calc)   eAG (mmol/L) 7.3 (calc)  Lipid Panel w/reflex Direct LDL  Result Value Ref Range   Cholesterol 124 <200 mg/dL   HDL 39 (L) >50 mg/dL   Triglycerides 103 <150 mg/dL   LDL Cholesterol (Calc) 66 mg/dL (calc)   Total CHOL/HDL Ratio 3.2 <5.0 (calc)   Non-HDL Cholesterol (Calc) 85 <130 mg/dL (calc)   Great news that she has lost weight. Continue on next 56 days.  Lipid ordered.  BP looks great.   Vaccines up to date.   Follow up in 3 months.

## 2018-07-09 LAB — HEMOGLOBIN A1C
EAG (MMOL/L): 7.3 (calc)
Hgb A1c MFr Bld: 6.2 % of total Hgb — ABNORMAL HIGH (ref <5.7)
Mean Plasma Glucose: 131 (calc)

## 2018-07-09 LAB — LIPID PANEL W/REFLEX DIRECT LDL
CHOLESTEROL: 124 mg/dL (ref <200)
HDL: 39 mg/dL — AB (ref >50)
LDL CHOLESTEROL (CALC): 66 mg/dL
Non-HDL Cholesterol (Calc): 85 mg/dL (calc) (ref <130)
TRIGLYCERIDES: 103 mg/dL (ref <150)
Total CHOL/HDL Ratio: 3.2 (calc) (ref <5.0)

## 2018-07-10 NOTE — Progress Notes (Signed)
Call pt: cholesterol is WAY better. HDL did decrease a little which hopefully exercise will get back up. I know you have worked really hard and lost weight but unfortunately the a1c did not decrease very much. Keep up the good work and we will recheck in 3 months.

## 2018-07-29 IMAGING — DX DG FOOT COMPLETE 3+V*L*
3 series · 3 of 3 positions shown · non-contrast
Comparison: None.

CLINICAL DATA: Left foot pain on the lateral side for 1 week. No
known injury.

EXAM:
LEFT FOOT - COMPLETE 3+ VIEW

[foot ap]
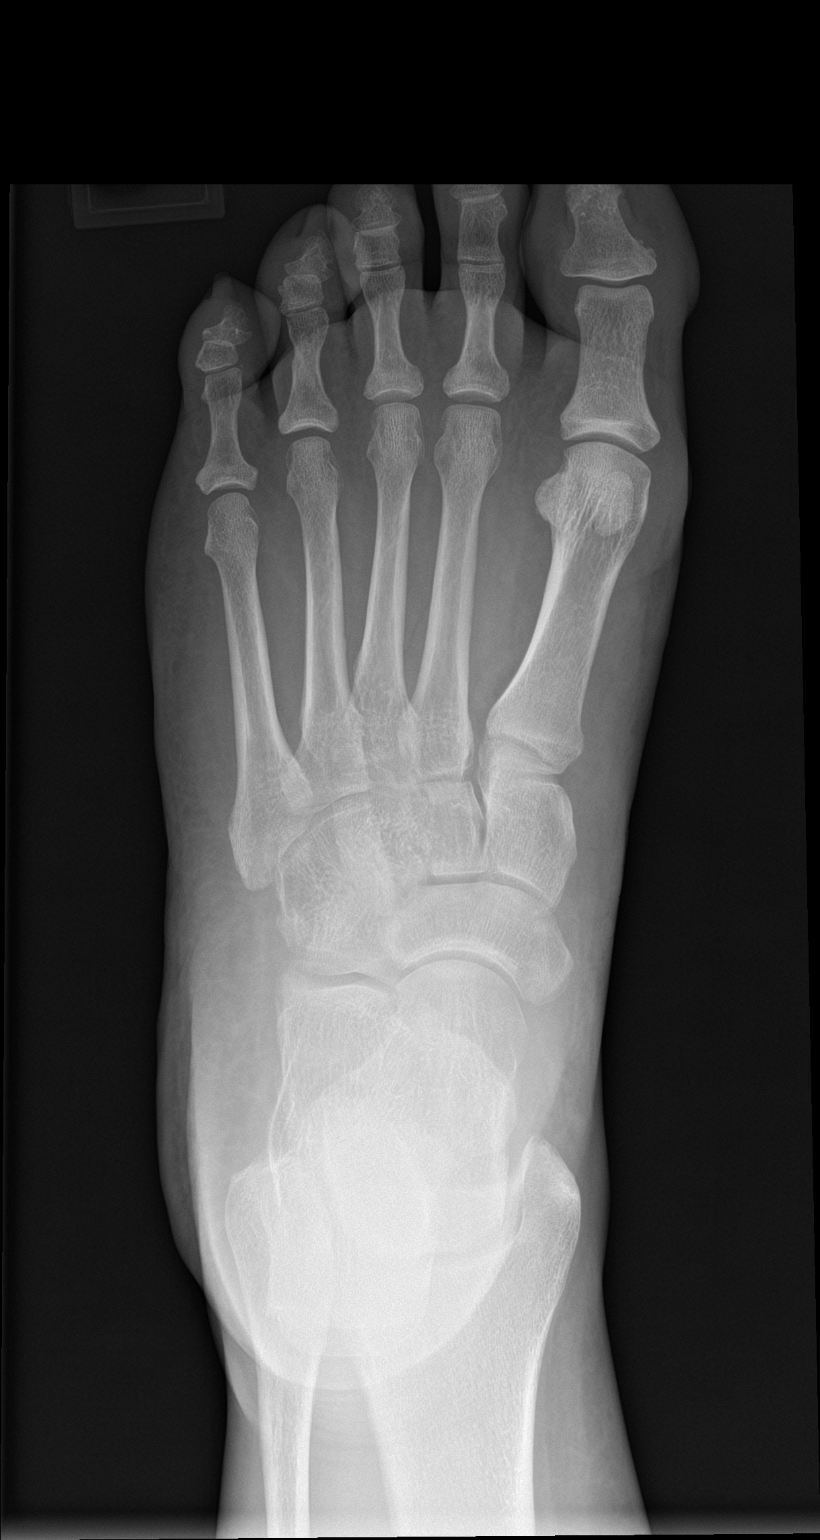

[foot obl]
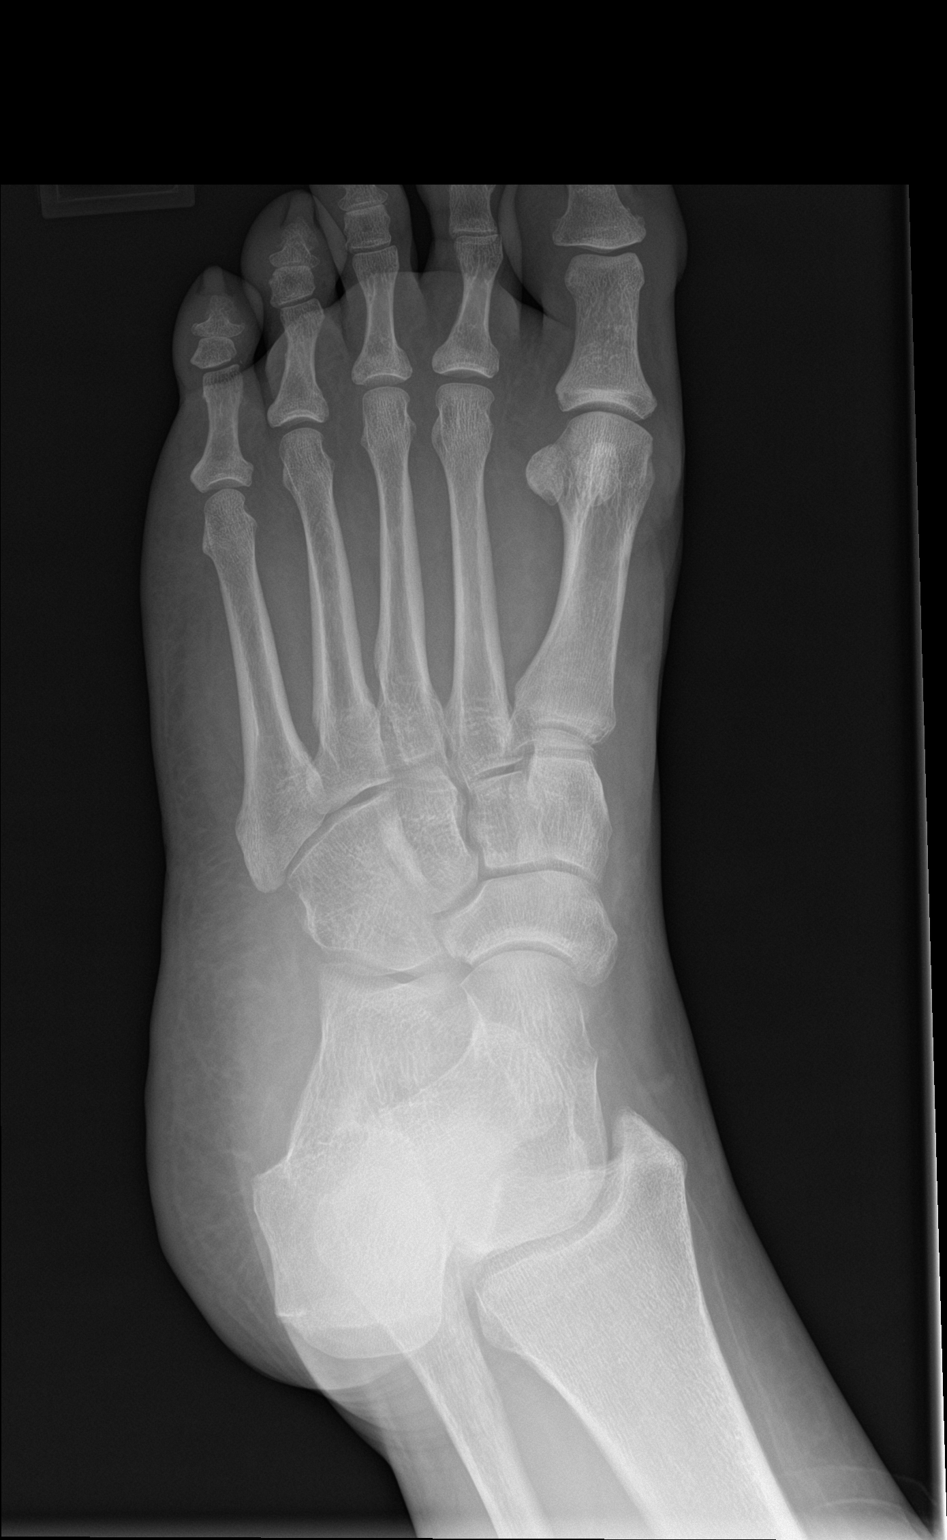

[foot lat]
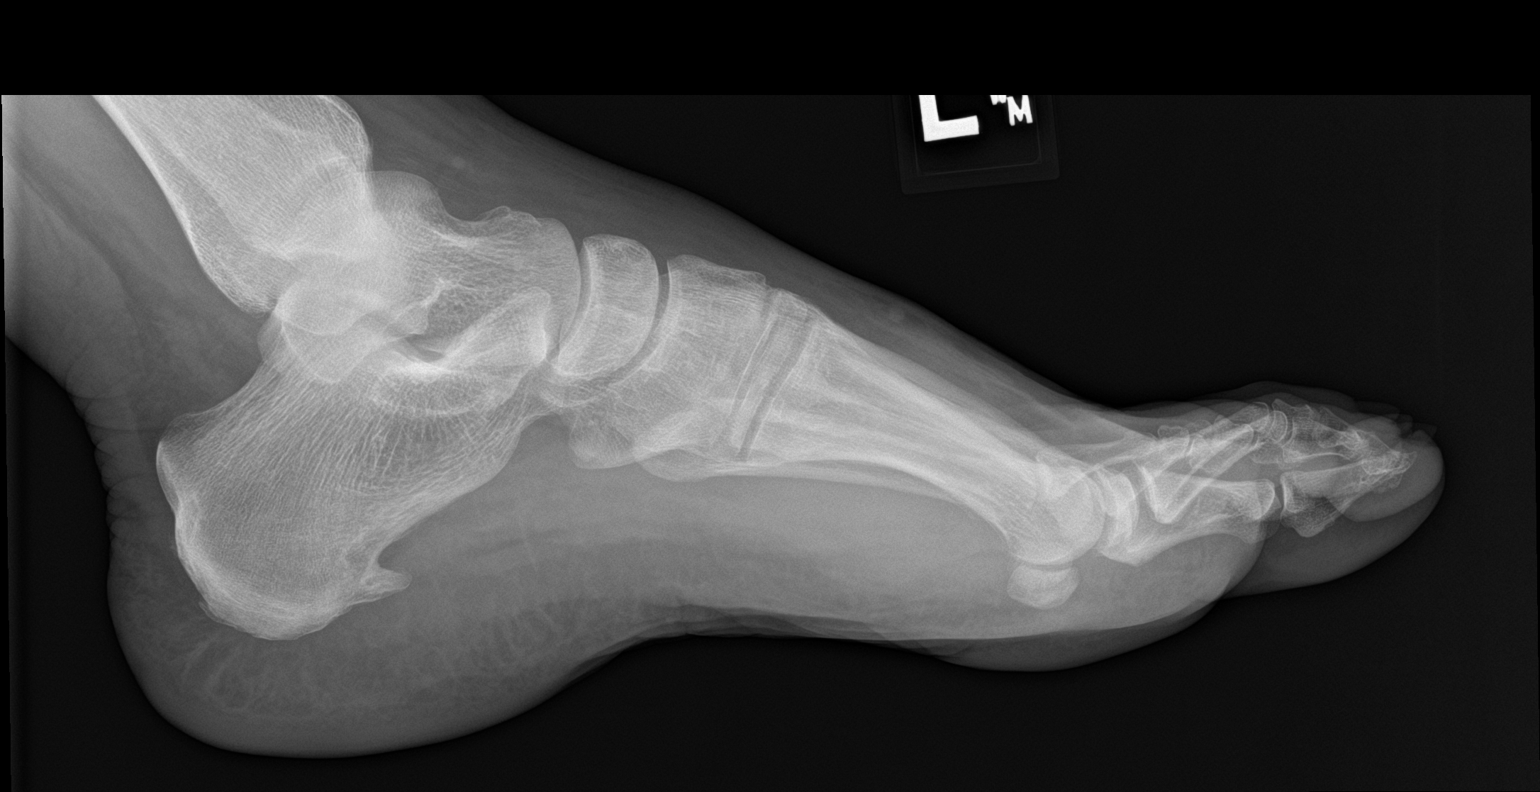

[3 of 3 positions shown; findings below may reference images not displayed]

FINDINGS: There is no evidence of fracture or dislocation. There is no
evidence of arthropathy or other focal bone abnormality. Plantar
calcaneal spur is noted. Soft tissues are unremarkable.
IMPRESSION: No acute abnormality.

Plantar calcaneal spur.

## 2018-08-30 ENCOUNTER — Other Ambulatory Visit: Payer: Self-pay | Admitting: Physician Assistant

## 2018-08-30 DIAGNOSIS — F330 Major depressive disorder, recurrent, mild: Secondary | ICD-10-CM

## 2018-09-03 ENCOUNTER — Other Ambulatory Visit: Payer: Self-pay | Admitting: Physician Assistant

## 2018-09-03 DIAGNOSIS — F330 Major depressive disorder, recurrent, mild: Secondary | ICD-10-CM

## 2018-10-18 ENCOUNTER — Encounter: Payer: Self-pay | Admitting: Physician Assistant

## 2018-10-18 ENCOUNTER — Ambulatory Visit (INDEPENDENT_AMBULATORY_CARE_PROVIDER_SITE_OTHER): Payer: BLUE CROSS/BLUE SHIELD | Admitting: Physician Assistant

## 2018-10-18 VITALS — BP 114/85 | HR 80 | Temp 97.9°F | Ht 67.99 in | Wt 218.0 lb

## 2018-10-18 DIAGNOSIS — Z1231 Encounter for screening mammogram for malignant neoplasm of breast: Secondary | ICD-10-CM

## 2018-10-18 DIAGNOSIS — R69 Illness, unspecified: Secondary | ICD-10-CM | POA: Diagnosis not present

## 2018-10-18 DIAGNOSIS — Z1211 Encounter for screening for malignant neoplasm of colon: Secondary | ICD-10-CM | POA: Diagnosis not present

## 2018-10-18 DIAGNOSIS — F17200 Nicotine dependence, unspecified, uncomplicated: Secondary | ICD-10-CM

## 2018-10-18 DIAGNOSIS — F330 Major depressive disorder, recurrent, mild: Secondary | ICD-10-CM

## 2018-10-18 DIAGNOSIS — J111 Influenza due to unidentified influenza virus with other respiratory manifestations: Secondary | ICD-10-CM

## 2018-10-18 MED ORDER — VILAZODONE HCL 40 MG PO TABS: ORAL_TABLET | ORAL | 1 refills | Status: DC

## 2018-10-18 MED ORDER — BUPROPION HCL ER (XL) 150 MG PO TB24: ORAL_TABLET | ORAL | 1 refills | Status: DC

## 2018-10-18 MED ORDER — FLUTICASONE PROPIONATE 50 MCG/ACT NA SUSP: 2.0000 | Freq: Every day | NASAL | 6 refills | Status: DC

## 2018-10-18 NOTE — Progress Notes (Signed)
Subjective:    Patient ID: Nina Taylor, female    DOB: [birth year], 63 y.o.   MRN: [id number]  HPI  Pt is a 63 yo female who presents to the clinic with flu like symptoms of fever, fatigue, chills, body aches for 5 days. She does feel like she is getting better. Pt did not have flu shot. She continues to feel tired and no energy.   She continues to work on weight loss. She is doing the 56 days program. She has been exercising as well. 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 She feels so much better. She is drinking kombucha.   She was not able to tolerate chantix. She continues to decrease smoking. Down to 1/2 pack.     .. Active Ambulatory Problems    Diagnosis Date Noted  . GERD (gastroesophageal reflux disease) 05/23/2014  . Heart attack ([place]) 05/23/2014  . CAD (coronary artery disease) 05/23/2014  . Depression 05/23/2014  . Diverticulitis 05/23/2014  . History of diverticulitis 05/25/2014  . Calculus of gallbladder 05/25/2014  . Cerumen impaction 10/23/2014  . Swelling of foot joint, left 01/10/2017  . Left breast mass 04/19/2017  . Tobacco abuse 05/04/2017  . Essential hypertension 05/18/2017  . Elevated fasting glucose 05/18/2017  . Anxiety 11/23/2017  . Class 2 obesity due to excess calories without serious comorbidity with body mass index (BMI) of 35.0 to 35.9 in adult 03/15/2018  . No energy 03/21/2018   Resolved Ambulatory Problems    Diagnosis Date Noted  . Morbid obesity ([place]) 01/10/2017   Past Medical History:  Diagnosis Date  . Coronary artery disease   . Pneumonia 2011  . PONV (postoperative nausea and vomiting)       Review of Systems See HPI.     Objective:   Physical Exam Vitals signs reviewed.  Constitutional:      Appearance: Normal appearance.  HENT:     Head: Normocephalic and atraumatic.     Right Ear: Tympanic membrane normal.     Left Ear: Tympanic membrane normal.     Nose: Congestion and rhinorrhea present.     Mouth/Throat:     Pharynx:  Oropharynx is clear.  Eyes:     Conjunctiva/sclera: Conjunctivae normal.     Pupils: Pupils are equal, round, and reactive to light.  Cardiovascular:     Rate and Rhythm: Normal rate and regular rhythm.     Pulses: Normal pulses.  Pulmonary:     Effort: Pulmonary effort is normal.     Breath sounds: Normal breath sounds.  Neurological:     General: No focal deficit present.     Mental Status: She is alert and oriented to person, place, and time.  Psychiatric:        Mood and Affect: Mood normal.        Behavior: Behavior normal.           Assessment & Plan:  Marland KitchenMarland KitchenDiagnoses and all orders for this visit:  Influenza-like illness -     fluticasone (FLONASE) 50 MCG/ACT nasal spray; Place 2 sprays into both nostrils daily.  Mild episode of recurrent major depressive disorder (HCC) -     Vilazodone HCl (VIIBRYD) 40 MG TABS; TAKE 1 TABLET BY MOUTH ONCE DAILY -     buPROPion (WELLBUTRIN XL) 150 MG 24 hr tablet; TAKE 1 TABLET BY MOUTH ONCE DAILY IN THE MORNING  Colon cancer screening -     Ambulatory referral to Gastroenterology  Visit for screening mammogram -  MM 3D SCREEN BREAST BILATERAL  Current smoker   With so many exposures likely had/has the flu. Seems to be on the improving end. flonase given from residual sinus drainage. Written out to rest for 2 days. Symptomatic care discussed.   Not able to tolerate chantix. Pt is decreasing on her own. Discussed importance of smoking cessation.   Refilled viibryd and wellbutrin.   Pt agreed to get colonoscopy and mammgram.

## 2018-10-18 NOTE — Patient Instructions (Signed)

## 2018-10-21 ENCOUNTER — Encounter: Payer: Self-pay | Admitting: Physician Assistant

## 2018-10-23 ENCOUNTER — Emergency Department (INDEPENDENT_AMBULATORY_CARE_PROVIDER_SITE_OTHER)
Admission: EM | Admit: 2018-10-23 | Discharge: 2018-10-23 | Disposition: A | Discharge status: Home / Self Care | Payer: BLUE CROSS/BLUE SHIELD | Source: Home / Self Care | Attending: Family Medicine | Admitting: Family Medicine

## 2018-10-23 ENCOUNTER — Other Ambulatory Visit: Payer: Self-pay

## 2018-10-23 DIAGNOSIS — M26621 Arthralgia of right temporomandibular joint: Secondary | ICD-10-CM

## 2018-10-23 DIAGNOSIS — H6591 Unspecified nonsuppurative otitis media, right ear: Secondary | ICD-10-CM

## 2018-10-23 DIAGNOSIS — H8111 Benign paroxysmal vertigo, right ear: Secondary | ICD-10-CM

## 2018-10-23 MED ORDER — AMOXICILLIN 875 MG PO TABS: 875.0000 mg | ORAL_TABLET | Freq: Two times a day (BID) | ORAL | 0 refills | Status: DC

## 2018-10-23 MED ORDER — ONDANSETRON HCL 4 MG/2ML IJ SOLN
4.0000 mg | Freq: Once | INTRAMUSCULAR | Status: AC
  Administered 2018-10-23: 4 mg via INTRAMUSCULAR

## 2018-10-23 MED ORDER — MECLIZINE HCL 25 MG PO TABS: ORAL_TABLET | ORAL | 0 refills | Status: DC

## 2018-10-23 MED ORDER — ONDANSETRON 4 MG PO TBDP
4.0000 mg | ORAL_TABLET | Freq: Once | ORAL | Status: AC
  Administered 2018-10-23: 4 mg via ORAL

## 2018-10-23 NOTE — ED Triage Notes (Signed)
Pt c/o vertigo/dizziness episode which started last night. Hx from 5 years ago. Came in vomiting due to dizziness which is a new symptom to her. Says she saw [facility] and was dx with flu. Now having RT ear pain. Pain 3/10

## 2018-10-23 NOTE — Discharge Instructions (Addendum)
Increase fluid intake. May take Tylenol as needed for pain. Put ice on the painful right temporomandibular joint: Put ice in a plastic bag. Place a towel between your skin and the bag. Leave the ice on for 20 minutes, 2-3 times a day.

## 2018-10-23 NOTE — ED Provider Notes (Signed)
[facility]    CSN: [id number] Arrival date & time: 10/23/18  1121     History   Chief Complaint Chief Complaint  Patient presents with  . Dizziness    HPI Nina Taylor is a 63 y.o. female.   Patient was diagnosed with influenza five days ago, and seemed to be improving.  Last night she developed vertigo symptoms with nausea and an episode of vomiting.  She noted right earache last night and left ear feels clogged.  She also complains of soreness over her left neck and shoulder area.  She admits that she had been lifting heavy furniture recently.  She denies paresthesias in arms.  The history is provided by the patient.    Past Medical History:  Diagnosis Date  . Anxiety   . Coronary artery disease   . Depression   . GERD (gastroesophageal reflux disease)   . Heart attack [facility]) 2011   in [place], [place] with [facility]  . Pneumonia 2011  . PONV (postoperative nausea and vomiting)     Patient Active Problem List   Diagnosis Date Noted  . No energy 03/21/2018  . Class 2 obesity due to excess calories without serious comorbidity with body mass index (BMI) of 35.0 to 35.9 in adult 03/15/2018  . Anxiety 11/23/2017  . Essential hypertension 05/18/2017  . Elevated fasting glucose 05/18/2017  . Tobacco abuse 05/04/2017  . Left breast mass 04/19/2017  . Swelling of foot joint, left 01/10/2017  . Cerumen impaction 10/23/2014  . History of diverticulitis 05/25/2014  . Calculus of gallbladder 05/25/2014  . GERD (gastroesophageal reflux disease) 05/23/2014  . Heart attack ([place]) 05/23/2014  . CAD (coronary artery disease) 05/23/2014  . Depression 05/23/2014  . Diverticulitis 05/23/2014    Past Surgical History:  Procedure Laterality Date  . CHOLECYSTECTOMY N/A 08/15/2014   Procedure: LAPAROSCOPIC CHOLECYSTECTOMY ;  Surgeon: Coralie Keens, MD;  Location: [facility];  Service: General;  Laterality: N/A;  Laparoscopic cholecystectomy   . CHOLECYSTECTOMY,  LAPAROSCOPIC  08/15/2014  . CORONARY ANGIOPLASTY WITH STENT PLACEMENT  2011  . TUBAL LIGATION  1984    OB History   No obstetric history on file.      Home Medications    Prior to Admission medications   Medication Sig Start Date End Date Taking? Authorizing Provider  albuterol (PROVENTIL HFA;VENTOLIN HFA) 108 (90 Base) MCG/ACT inhaler Inhale 2 puffs into the lungs every 6 (six) hours as needed for wheezing or shortness of breath. 02/03/17   Breeback, Jade L, PA-C  amoxicillin (AMOXIL) 875 MG tablet Take 1 tablet (875 mg total) by mouth 2 (two) times daily. 10/23/18   Kandra Nicolas, MD  aspirin 81 MG tablet Take 81 mg by mouth daily.    [provider]  buPROPion (WELLBUTRIN XL) 150 MG 24 hr tablet TAKE 1 TABLET BY MOUTH ONCE DAILY IN THE MORNING 10/18/18   Breeback, Jade L, PA-C  fluticasone (FLONASE) 50 MCG/ACT nasal spray Place 2 sprays into both nostrils daily. 10/18/18   Breeback, Jade L, PA-C  hydrochlorothiazide (HYDRODIURIL) 12.5 MG tablet Take 1 tablet (12.5 mg total) by mouth daily. As needed for left leg swelling. 03/15/18   Donella Stade, PA-C  meclizine (ANTIVERT) 25 MG tablet Take one tab PO, one to three times daily PRN dizziness 10/23/18   Kandra Nicolas, MD  simvastatin (ZOCOR) 20 MG tablet Take 1 tablet (20 mg total) by mouth daily. Patient not taking: Reported on 10/18/2018 03/15/18   Iran Planas  L, PA-C  Vilazodone HCl (VIIBRYD) 40 MG TABS TAKE 1 TABLET BY MOUTH ONCE DAILY 10/18/18   Donella Stade, PA-C    Family History Family History  Problem Relation Age of Onset  . Cancer Other     Social History Social History   Tobacco Use  . Smoking status: Current Every Day Smoker    Packs/day: 0.25    Years: 20.00    Pack years: 5.00    Types: Cigarettes    Start date: 09/10/1993  . Smokeless tobacco: Never Used  Substance Use Topics  . Alcohol use: Yes    Alcohol/week: 0.0 standard drinks    Comment: wine rarely  . Drug use: No     Allergies     Chantix [varenicline] and Vicodin [hydrocodone-acetaminophen]   Review of Systems Review of Systems No sore throat No cough No pleuritic pain No wheezing + nasal congestion + post-nasal drainage No sinus pain/pressure No itchy/red eyes + right earache + dizziness No hemoptysis No SOB No fever/chills + nausea + vomiting, resolved No abdominal pain No diarrhea No urinary symptoms No skin rash + fatigue No myalgias No headache Used OTC meds without relief   Physical Exam Triage Vital Signs ED Triage Vitals [10/23/18 1145]  Enc Vitals Group     BP 130/81     Pulse Rate 83     Resp      Temp 98 F (36.7 C)     Temp Source Oral     SpO2 96 %     Weight 218 lb (98.9 kg)     Height 5\' 7"  (1.702 m)     Head Circumference      Peak Flow      Pain Score 0     Pain Loc      Pain Edu?      Excl. in [place]?    No data found.  Updated Vital Signs BP 130/81 (BP Location: Right Arm)   Pulse 83   Temp 98 F (36.7 C) (Oral)   Ht 5\' 7"  (1.702 m)   Wt 98.9 kg   SpO2 96%   BMI 34.14 kg/m   Visual Acuity Right Eye Distance:   Left Eye Distance:   Bilateral Distance:    Right Eye Near:   Left Eye Near:    Bilateral Near:     Physical Exam Nursing notes and Vital Signs reviewed. Appearance:  Patient appears stated age, and in no acute distress Eyes:  Pupils are equal, round, and reactive to light and accomodation.  Extraocular movement is intact.  Conjunctivae are not inflamed.  Fundi benign.  No nystagmus. Ears:  Canals normal.  Tympanic membranes normal. There is distinct tenderness to palpation over the right TMJ. Nose:  Mildly congested turbinates.  No sinus tenderness.     Pharynx:  Normal Neck:  Supple.  No adenopathy.  There is tenderness to palpation over the left trapezius muscle. Lungs:  Clear to auscultation.  Breath sounds are equal.  Moving air well. Heart:  Regular rate and rhythm without murmurs, rubs, or gallops.  Abdomen:  Nontender without  masses or hepatosplenomegaly.  Bowel sounds are present.  No CVA or flank tenderness.  Extremities:  No edema.  Skin:  No rash present.   Neurologic:  Cranial nerves 2 through 12 are normal.  Patellar, achilles, and elbow reflexes are normal.  Cerebellar function is intact (finger-to-nose and rapid alternating hand movement).  Gait and station are normal.  UC Treatments / Results  Labs (all labs ordered are listed, but only abnormal results are displayed) Labs Reviewed -   Tympanometry:  Right ear tympanogram positive peak pressure, and tympanogram wide; Left ear tympanogram high peak height  EKG None  Radiology No results found.  Procedures Procedures (including critical care time)  Medications Ordered in UC Medications  ondansetron (ZOFRAN-ODT) disintegrating tablet 4 mg (4 mg Oral Given 10/23/18 1210)  ondansetron (ZOFRAN) injection 4 mg (4 mg Intramuscular Given 10/23/18 1211)    Initial Impression / Assessment and Plan / UC Course  I have reviewed the triage vital signs and the nursing notes.  Pertinent labs & imaging results that were available during my care of the patient were reviewed by me and considered in my medical decision making (see chart for details).      Although right tympanic membrane appears relatively normal, tympanogram is distinctly abnormal in the right ear.  Begin amoxicillin 875mg  BID for 10 days. Begin meclizine for vertigo. Followup with ENT if not improving about 10 days.  Patient also complains of soreness/tenderness in left trapezius area after lifting heavy furniture.  Recommend that she apply ice pack several times daily.  Followup with PCP if not improving one week.   Final Clinical Impressions(s) / UC Diagnoses   Final diagnoses:  Right otitis media with effusion  Arthralgia of right temporomandibular joint  Benign paroxysmal positional vertigo of right ear     Discharge Instructions     Increase fluid intake. May take Tylenol  as needed for pain.  Put ice on the painful right temporomandibular joint: ? Put ice in a plastic bag. ? Place a towel between your skin and the bag. ? Leave the ice on for 20 minutes, 2-3 times a day.    ED Prescriptions    Medication Sig Dispense Auth. Provider   amoxicillin (AMOXIL) 875 MG tablet Take 1 tablet (875 mg total) by mouth 2 (two) times daily. 20 tablet Kandra Nicolas, MD   meclizine (ANTIVERT) 25 MG tablet Take one tab PO, one to three times daily PRN dizziness 20 tablet Kandra Nicolas, MD         Kandra Nicolas, MD 10/26/18 1124

## 2018-10-23 NOTE — ED Notes (Signed)
Pt immediately vomitted after the Zofran ODT. Per DR Assunta Found, ok to give Zofran 4mg  IM.

## 2018-10-24 ENCOUNTER — Telehealth: Payer: Self-pay

## 2018-11-14 ENCOUNTER — Telehealth: Payer: Self-pay

## 2018-11-14 NOTE — Telephone Encounter (Signed)
Thank you :)

## 2018-11-14 NOTE — Telephone Encounter (Signed)
I called pt and advised her that [facility] was not able to contact her regarding referral and pt states she is currently un-insired. When she received insurance in 30 days, she states she will call them and get set up.

## 2018-12-19 ENCOUNTER — Encounter: Payer: Self-pay | Admitting: Physician Assistant

## 2018-12-20 ENCOUNTER — Telehealth (INDEPENDENT_AMBULATORY_CARE_PROVIDER_SITE_OTHER): Payer: Self-pay | Admitting: Physician Assistant

## 2018-12-20 ENCOUNTER — Other Ambulatory Visit: Payer: Self-pay

## 2018-12-20 VITALS — BP 117/78 | HR 83 | Wt 209.0 lb

## 2018-12-20 DIAGNOSIS — F330 Major depressive disorder, recurrent, mild: Secondary | ICD-10-CM

## 2018-12-20 DIAGNOSIS — F419 Anxiety disorder, unspecified: Secondary | ICD-10-CM

## 2018-12-20 MED ORDER — LORAZEPAM 0.5 MG PO TABS: 0.5000 mg | ORAL_TABLET | Freq: Three times a day (TID) | ORAL | 0 refills | Status: DC | PRN

## 2018-12-20 MED ORDER — BUPROPION HCL ER (XL) 150 MG PO TB24: ORAL_TABLET | ORAL | 1 refills | Status: DC

## 2018-12-20 NOTE — Telephone Encounter (Signed)
Patient scheduled.

## 2018-12-20 NOTE — Progress Notes (Signed)
-  stopped all prescription medications, just eats healthier, per pt   -tapered down on antidepressants in February and ended in March  -yoga online and waling daily helps patient's depressions- feels that COVID has worsened SX  -wanting "situational" helping medication to use PRN, does not want maintenance medication  -Please see PHQ/GAD

## 2018-12-20 NOTE — Progress Notes (Signed)
Patient ID: Nina Taylor, female   DOB: 11-21-1955, 63 y.o.   MRN: [id number]     .Marland KitchenVirtual Visit via Video Note  I connected with Nina Taylor on 12/22/18 at  9:50 AM EDT by a video enabled telemedicine application and verified that I am speaking with the correct person using two identifiers.   I discussed the limitations of evaluation and management by telemedicine and the availability of in person appointments. The patient expressed understanding and agreed to proceed.  History of Present Illness: Pt is a 63 yo obese female with HTN, Depression and anxiety who presents to the clinic to discuss worsening mood and anxiety.   Pt stopped all medications in febuary and tapered over march. COVID pandemic started in march. She felt like she was doing "ok" but lately has felt worse and worse. She is having trouble sleeping, focusing. She is eating healthy and exercising. She is doing a lot of yoga but still having intense worrying spells and just overall down mood. She does not want to go back on anti-depressants. No SI/HC.   She also stopped zocor. 5 months ago her a1c and lipid were doing so well.   .. Active Ambulatory Problems    Diagnosis Date Noted  . GERD (gastroesophageal reflux disease) 05/23/2014  . Heart attack ([place]) 05/23/2014  . CAD (coronary artery disease) 05/23/2014  . Depression 05/23/2014  . Diverticulitis 05/23/2014  . History of diverticulitis 05/25/2014  . Calculus of gallbladder 05/25/2014  . Cerumen impaction 10/23/2014  . Swelling of foot joint, left 01/10/2017  . Left breast mass 04/19/2017  . Tobacco abuse 05/04/2017  . Essential hypertension 05/18/2017  . Elevated fasting glucose 05/18/2017  . Anxiety 11/23/2017  . Class 2 obesity due to excess calories without serious comorbidity with body mass index (BMI) of 35.0 to 35.9 in adult 03/15/2018  . No energy 03/21/2018   Resolved Ambulatory Problems    Diagnosis Date Noted  . Morbid obesity ([place]) 01/10/2017    Past Medical History:  Diagnosis Date  . Coronary artery disease   . Pneumonia 2011  . PONV (postoperative nausea and vomiting)    Reviewed med, allergy, problem list.    Observations/Objective: No acute distress. Obese female.  Anxious mood.   .. Today's Vitals   12/20/18 0914  BP: 117/78  Pulse: 83  Weight: 209 lb (94.8 kg)   Body mass index is 32.73 kg/m.  .. Depression screen [facility] 2/9 12/20/2018 03/15/2018 05/18/2017 01/08/2017  Decreased Interest 2 1 0 3  Down, Depressed, Hopeless 2 1 0 2  PHQ - 2 Score 4 2 0 5  Altered sleeping 2 1 0 2  Tired, decreased energy 0 1 1 2   Change in appetite 0 3 0 2  Feeling bad or failure about yourself  0 0 0 1  Trouble concentrating 3 1 0 3  Moving slowly or fidgety/restless 3 2 0 3  Suicidal thoughts 0 0 0 0  PHQ-9 Score 12 10 1 18   Difficult doing work/chores Extremely dIfficult Somewhat difficult Not difficult at all -   .. GAD 7 : Generalized Anxiety Score 12/20/2018 03/15/2018  Nervous, Anxious, on Edge 3 1  Control/stop worrying 3 1  Worry too much - different things 3 1  Trouble relaxing 3 2  Restless 3 2  Easily annoyed or irritable 3 1  Afraid - awful might happen 3 1  Total GAD 7 Score 21 9  Anxiety Difficulty Very difficult Somewhat difficult     Assessment and  Plan: ..Nina Taylor was seen today for medication management.  Diagnoses and all orders for this visit:  Anxiety -     LORazepam (ATIVAN) 0.5 MG tablet; Take 1 tablet (0.5 mg total) by mouth every 8 (eight) hours as needed for anxiety.  Mild episode of recurrent major depressive disorder (HCC) -     buPROPion (WELLBUTRIN XL) 150 MG 24 hr tablet; TAKE 1 TABLET BY MOUTH ONCE DAILY IN THE MORNING   Discussed medications. Agreed to go back on wellbutrin daily and ativan as needed. I discussed how ativan needs to be a temporary solution.discussed dependency issues.  Continue with exercise and yoga. Mentioned counseling. Pt declined for now.   In the next 2  months need to check lipid and a1c since stopped medication to make sure labs stay looking good.   Follow up in 2 months.   Follow Up Instructions:    I discussed the assessment and treatment plan with the patient. The patient was provided an opportunity to ask questions and all were answered. The patient agreed with the plan and demonstrated an understanding of the instructions.   The patient was advised to call back or seek an in-person evaluation if the symptoms worsen or if the condition fails to improve as anticipated.  I provided 21 minutes of non-face-to-face time during this encounter.   Iran Planas, PA-C

## 2018-12-22 ENCOUNTER — Encounter: Payer: Self-pay | Admitting: Physician Assistant

## 2019-01-12 ENCOUNTER — Other Ambulatory Visit: Payer: Self-pay | Admitting: Physician Assistant

## 2019-01-12 DIAGNOSIS — F419 Anxiety disorder, unspecified: Secondary | ICD-10-CM

## 2019-01-16 ENCOUNTER — Ambulatory Visit: Payer: BLUE CROSS/BLUE SHIELD | Admitting: Physician Assistant

## 2019-03-22 ENCOUNTER — Other Ambulatory Visit: Payer: Self-pay

## 2019-03-22 ENCOUNTER — Telehealth: Payer: BLUE CROSS/BLUE SHIELD | Admitting: Family

## 2019-03-22 ENCOUNTER — Telehealth (INDEPENDENT_AMBULATORY_CARE_PROVIDER_SITE_OTHER): Payer: BLUE CROSS/BLUE SHIELD | Admitting: Physician Assistant

## 2019-03-22 ENCOUNTER — Encounter: Payer: Self-pay | Admitting: Physician Assistant

## 2019-03-22 VITALS — BP 115/86 | HR 79 | Temp 95.7°F | Ht 68.0 in | Wt 213.0 lb

## 2019-03-22 DIAGNOSIS — R05 Cough: Secondary | ICD-10-CM | POA: Diagnosis not present

## 2019-03-22 DIAGNOSIS — J209 Acute bronchitis, unspecified: Secondary | ICD-10-CM | POA: Diagnosis not present

## 2019-03-22 DIAGNOSIS — R059 Cough, unspecified: Secondary | ICD-10-CM

## 2019-03-22 DIAGNOSIS — F330 Major depressive disorder, recurrent, mild: Secondary | ICD-10-CM

## 2019-03-22 DIAGNOSIS — R053 Chronic cough: Secondary | ICD-10-CM

## 2019-03-22 MED ORDER — ALBUTEROL SULFATE HFA 108 (90 BASE) MCG/ACT IN AERS: 2.0000 | INHALATION_SPRAY | Freq: Four times a day (QID) | RESPIRATORY_TRACT | 1 refills | Status: DC | PRN

## 2019-03-22 MED ORDER — BUPROPION HCL ER (XL) 300 MG PO TB24: 300.0000 mg | ORAL_TABLET | Freq: Every day | ORAL | 1 refills | Status: DC

## 2019-03-22 NOTE — Progress Notes (Signed)
Patient ID: Nina Taylor, female   DOB: [birth year], 63 y.o.   MRN: [id number] .Marland KitchenVirtual Visit via Video Note  I connected with Nina Mondor on 03/24/19 at  8:30 AM EDT by a video enabled telemedicine application and verified that I am speaking with the correct person using two identifiers.  Location: Patient: home Provider: clinic   I discussed the limitations of evaluation and management by telemedicine and the availability of in person appointments. The patient expressed understanding and agreed to proceed.  History of Present Illness: Pt is a 63 yo female with HTN, CAD, GERD and current smoker who calls into the clinic to discuss ongoing cough and worsening anxiety and depression.   She is taking care of her high covid risk for complications sister and starting a new job. She only leaves the home to swim outiside at the [facility] and groceries. She has had this linguring cough for over 2 months. She is a smoker and smoking more. No one else in the home has been sick. Cough is worse when she goes to bed. No SOB or chest tightness. Not using any albuterol. No fever, sinus pressure or ear pain. No GI symptoms.   She would like to restart viibrd for you mood. She was doing great on it but started costing too much. She has new insurance now.    .. Active Ambulatory Problems    Diagnosis Date Noted  . GERD (gastroesophageal reflux disease) 05/23/2014  . Heart attack ([place]) 05/23/2014  . CAD (coronary artery disease) 05/23/2014  . Depression 05/23/2014  . Diverticulitis 05/23/2014  . History of diverticulitis 05/25/2014  . Calculus of gallbladder 05/25/2014  . Cerumen impaction 10/23/2014  . Swelling of foot joint, left 01/10/2017  . Left breast mass 04/19/2017  . Tobacco abuse 05/04/2017  . Essential hypertension 05/18/2017  . Elevated fasting glucose 05/18/2017  . Anxiety 11/23/2017  . Class 2 obesity due to excess calories without serious comorbidity with body mass index (BMI) of 35.0 to  35.9 in adult 03/15/2018  . No energy 03/21/2018   Resolved Ambulatory Problems    Diagnosis Date Noted  . Morbid obesity ([place]) 01/10/2017   Past Medical History:  Diagnosis Date  . Coronary artery disease   . Pneumonia 2011  . PONV (postoperative nausea and vomiting)    Reviewed med, allergy, problem list.    Observations/Objective: No acute distress.    .. Depression screen [facility] 2/9 03/22/2019 12/20/2018 03/15/2018 05/18/2017 01/08/2017  Decreased Interest 1 2 1  0 3  Down, Depressed, Hopeless 0 2 1 0 2  PHQ - 2 Score 1 4 2  0 5  Altered sleeping 0 2 1 0 2  Tired, decreased energy 0 0 1 1 2   Change in appetite 3 0 3 0 2  Feeling bad or failure about yourself  0 0 0 0 1  Trouble concentrating 3 3 1  0 3  Moving slowly or fidgety/restless 3 3 2  0 3  Suicidal thoughts 0 0 0 0 0  PHQ-9 Score 10 12 10 1 18   Difficult doing work/chores Very difficult Extremely dIfficult Somewhat difficult Not difficult at all -   .. GAD 7 : Generalized Anxiety Score 03/22/2019 12/20/2018 03/15/2018  Nervous, Anxious, on Edge 3 3 1   Control/stop worrying 3 3 1   Worry too much - different things 3 3 1   Trouble relaxing 3 3 2   Restless 3 3 2   Easily annoyed or irritable 2 3 1   Afraid - awful might happen 2 3 1  Total GAD 7 Score 19 21 9   Anxiety Difficulty Somewhat difficult Very difficult Somewhat difficult      Assessment and Plan: Marland KitchenMarland KitchenDinita was seen today for cough.  Diagnoses and all orders for this visit:  Cough, persistent  Acute bronchitis, unspecified organism -     albuterol (VENTOLIN HFA) 108 (90 Base) MCG/ACT inhaler; Inhale 2 puffs into the lungs every 6 (six) hours as needed for wheezing or shortness of breath.  Mild episode of recurrent major depressive disorder (HCC) -     buPROPion (WELLBUTRIN XL) 300 MG 24 hr tablet; Take 1 tablet (300 mg total) by mouth daily.   Encouraged patient to get tested for covid. Pt declines stating she has been coughing and feels fine. She felt  like her sister would have it by now. Continue to use albuterol.consider OTC mucinex.  Certainly could be some COPD component to it. Needs spirometry when she feels stable and can get in.   Decided to increase wellbutrin to 300mg  first. Will see if mood improves. If not will try viibryd.    Follow Up Instructions:    I discussed the assessment and treatment plan with the patient. The patient was provided an opportunity to ask questions and all were answered. The patient agreed with the plan and demonstrated an understanding of the instructions.   The patient was advised to call back or seek an in-person evaluation if the symptoms worsen or if the condition fails to improve as anticipated.   Iran Planas, PA-C

## 2019-03-22 NOTE — Progress Notes (Signed)
Given that you are being seen today with your Primary office, I recommend following the advice they give you. Hope you feel better!

## 2019-03-22 NOTE — Progress Notes (Signed)
Cough/chest congestion off and on 1 week but worse last couple days. Requesting refill of inhaler. No fever. No other symptoms. Patient is a smoker, smoking more lately. PHQ9-GAD7 completed.

## 2019-03-24 ENCOUNTER — Encounter: Payer: Self-pay | Admitting: Physician Assistant

## 2019-04-13 ENCOUNTER — Encounter: Payer: Self-pay | Admitting: Physician Assistant

## 2019-04-24 ENCOUNTER — Encounter: Payer: Self-pay | Admitting: Physician Assistant

## 2019-04-24 ENCOUNTER — Ambulatory Visit (INDEPENDENT_AMBULATORY_CARE_PROVIDER_SITE_OTHER): Payer: BLUE CROSS/BLUE SHIELD | Admitting: Physician Assistant

## 2019-04-24 VITALS — Ht 68.0 in | Wt 213.0 lb

## 2019-04-24 DIAGNOSIS — R059 Cough, unspecified: Secondary | ICD-10-CM

## 2019-04-24 DIAGNOSIS — R05 Cough: Secondary | ICD-10-CM | POA: Diagnosis not present

## 2019-04-24 DIAGNOSIS — J0110 Acute frontal sinusitis, unspecified: Secondary | ICD-10-CM | POA: Diagnosis not present

## 2019-04-24 DIAGNOSIS — R531 Weakness: Secondary | ICD-10-CM | POA: Diagnosis not present

## 2019-04-24 DIAGNOSIS — U071 COVID-19: Secondary | ICD-10-CM

## 2019-04-24 MED ORDER — AZITHROMYCIN 250 MG PO TABS: ORAL_TABLET | ORAL | 0 refills | Status: DC

## 2019-04-24 NOTE — Progress Notes (Signed)
Tested positive for Covid. Had mild symptoms - sinus/cough/chest pressure. She is on her 12th day. Patient wants labs drawn to check to make sure everything is okay.

## 2019-04-25 ENCOUNTER — Encounter: Payer: Self-pay | Admitting: Physician Assistant

## 2019-04-25 ENCOUNTER — Ambulatory Visit: Payer: BLUE CROSS/BLUE SHIELD

## 2019-04-25 NOTE — Progress Notes (Signed)
Patient ID: Nina Taylor, female   DOB: 1955-12-09, 63 y.o.   MRN: [id number] .Marland KitchenVirtual Visit via Video Note  I connected with Nina Taylor on 04/25/19 at  3:20 PM EDT by a video enabled telemedicine application and verified that I am speaking with the correct person using two identifiers.  Location: Patient: work Provider: clinic   I discussed the limitations of evaluation and management by telemedicine and the availability of in person appointments. The patient expressed understanding and agreed to proceed.  History of Present Illness: Pt is a 63 yo female with recent hx of positive covid test 12 days ago. She quarantined and now back at work. She denies any fever, chills, body aches. She continues to have a lot of sinus pressure, headaches, congestion, cough. She is also really weak. She is concerned about secondary effects of COVID. She would like to "be checked out". Pt has hx of sinus infections. Pt does smoke but has cut back a lot. She is smoking 1 to 2 cigs a day. Denies any new swelling of extremities. She is SOb but not much out of her normal.   .. Active Ambulatory Problems    Diagnosis Date Noted  . GERD (gastroesophageal reflux disease) 05/23/2014  . Heart attack ([place]) 05/23/2014  . CAD (coronary artery disease) 05/23/2014  . Depression 05/23/2014  . Diverticulitis 05/23/2014  . History of diverticulitis 05/25/2014  . Calculus of gallbladder 05/25/2014  . Cerumen impaction 10/23/2014  . Swelling of foot joint, left 01/10/2017  . Left breast mass 04/19/2017  . Tobacco abuse 05/04/2017  . Essential hypertension 05/18/2017  . Elevated fasting glucose 05/18/2017  . Anxiety 11/23/2017  . Class 2 obesity due to excess calories without serious comorbidity with body mass index (BMI) of 35.0 to 35.9 in adult 03/15/2018  . No energy 03/21/2018   Resolved Ambulatory Problems    Diagnosis Date Noted  . Morbid obesity ([place]) 01/10/2017   Past Medical History:  Diagnosis Date   . Coronary artery disease   . Pneumonia 2011  . PONV (postoperative nausea and vomiting)    Reviewed med, allergy, problem list.     Observations/Objective: No acute distress. Normal breathing.  Normal mood.  Congested sounding. No peripheral edema.    .. Today's Vitals   04/24/19 1530  Weight: 213 lb (96.6 kg)  Height: 5\' 8"  (1.727 m)   Body mass index is 32.39 kg/m.   Assessment and Plan: Marland KitchenMarland KitchenFusaye was seen today for advice only.  Diagnoses and all orders for this visit:  Acute non-recurrent frontal sinusitis -     azithromycin (ZITHROMAX Z-PAK) 250 MG tablet; Take 2 tablets (500 mg) on  Day 1,  followed by 1 tablet (250 mg) once daily on Days 2 through 5.  COVID-19 virus infection -     COMPLETE METABOLIC PANEL WITH GFR -     CBC with Differential/Platelet  Cough -     DG Chest 2 View -     COMPLETE METABOLIC PANEL WITH GFR -     CBC with Differential/Platelet  Weakness -     DG Chest 2 View -     COMPLETE METABOLIC PANEL WITH GFR -     CBC with Differential/Platelet   I suspect patient is having second sickening with sinus infection. Will treat with zpak. Continue with flonase.  Due to cough will get CXR.  Due to weakness will check CBC and CMP.  Encouraged to completely quit smoking.  If begins to swell or worsening SOB  call office and follow up.    Follow Up Instructions:    I discussed the assessment and treatment plan with the patient. The patient was provided an opportunity to ask questions and all were answered. The patient agreed with the plan and demonstrated an understanding of the instructions.   The patient was advised to call back or seek an in-person evaluation if the symptoms worsen or if the condition fails to improve as anticipated.    Iran Planas, PA-C

## 2019-05-05 ENCOUNTER — Other Ambulatory Visit: Payer: Self-pay

## 2019-05-05 ENCOUNTER — Ambulatory Visit (INDEPENDENT_AMBULATORY_CARE_PROVIDER_SITE_OTHER): Payer: BLUE CROSS/BLUE SHIELD

## 2019-05-05 DIAGNOSIS — R05 Cough: Secondary | ICD-10-CM | POA: Diagnosis not present

## 2019-05-05 DIAGNOSIS — U071 COVID-19: Secondary | ICD-10-CM | POA: Diagnosis not present

## 2019-05-05 DIAGNOSIS — R531 Weakness: Secondary | ICD-10-CM

## 2019-05-05 NOTE — Progress Notes (Signed)
Normal CXR. Looks great.

## 2019-05-06 LAB — COMPLETE METABOLIC PANEL WITH GFR
AG Ratio: 1.5 (calc) (ref 1.0–2.5)
ALT: 25 U/L (ref 6–29)
AST: 15 U/L (ref 10–35)
Albumin: 4.2 g/dL (ref 3.6–5.1)
Alkaline phosphatase (APISO): 84 U/L (ref 37–153)
BUN: 18 mg/dL (ref 7–25)
CO2: 22 mmol/L (ref 20–32)
Calcium: 9.4 mg/dL (ref 8.6–10.4)
Chloride: 108 mmol/L (ref 98–110)
Creat: 0.67 mg/dL (ref 0.50–0.99)
GFR, Est African American: 108 mL/min/{1.73_m2} (ref >=60)
GFR, Est Non African American: 94 mL/min/{1.73_m2} (ref >=60)
Globulin: 2.8 g/dL (calc) (ref 1.9–3.7)
Glucose, Bld: 106 mg/dL — ABNORMAL HIGH (ref 65–99)
Potassium: 4.4 mmol/L (ref 3.5–5.3)
Sodium: 138 mmol/L (ref 135–146)
Total Bilirubin: 0.3 mg/dL (ref 0.2–1.2)
Total Protein: 7 g/dL (ref 6.1–8.1)

## 2019-05-06 LAB — CBC WITH DIFFERENTIAL/PLATELET
Absolute Monocytes: 995 cells/uL — ABNORMAL HIGH (ref 200–950)
Basophils Absolute: 59 cells/uL (ref 0–200)
Basophils Relative: 0.5 %
Eosinophils Absolute: 199 cells/uL (ref 15–500)
Eosinophils Relative: 1.7 %
HCT: 43.1 % (ref 35.0–45.0)
Hemoglobin: 13.9 g/dL (ref 11.7–15.5)
Lymphs Abs: 3615 cells/uL (ref 850–3900)
MCH: 29.1 pg (ref 27.0–33.0)
MCHC: 32.3 g/dL (ref 32.0–36.0)
MCV: 90.4 fL (ref 80.0–100.0)
MPV: 11.3 fL (ref 7.5–12.5)
Monocytes Relative: 8.5 %
Neutro Abs: 6833 cells/uL (ref 1500–7800)
Neutrophils Relative %: 58.4 %
Platelets: 236 10*3/uL (ref 140–400)
RBC: 4.77 10*6/uL (ref 3.80–5.10)
RDW: 12.8 % (ref 11.0–15.0)
Total Lymphocyte: 30.9 %
WBC: 11.7 10*3/uL — ABNORMAL HIGH (ref 3.8–10.8)

## 2019-05-08 NOTE — Progress Notes (Signed)
Kidney, liver, glucose look great.  Platelets look good.  White count a little above normal but seems to be around your baseline.

## 2019-05-18 ENCOUNTER — Encounter: Payer: Self-pay | Admitting: Physician Assistant

## 2019-05-19 NOTE — Telephone Encounter (Signed)
Spoke with Pt, offered visit today in office but she was busy with work. Did schedule for Monday with PCP per request. States the pain gets worse with food. Went over bland diet with her and advised to follow that until evaluation. If pain gets worse or if she develops a fever advised to seek care over the weekend. Verbalized understanding, no further questions.

## 2019-05-19 NOTE — Telephone Encounter (Signed)
Pt advised.

## 2019-05-19 NOTE — Telephone Encounter (Signed)
Suggest pepcid twice a day over the weekend as well.

## 2019-05-20 ENCOUNTER — Encounter: Payer: Self-pay | Admitting: Physician Assistant

## 2019-05-22 ENCOUNTER — Ambulatory Visit: Payer: BLUE CROSS/BLUE SHIELD | Admitting: Physician Assistant

## 2019-05-25 ENCOUNTER — Ambulatory Visit (INDEPENDENT_AMBULATORY_CARE_PROVIDER_SITE_OTHER): Payer: BLUE CROSS/BLUE SHIELD | Admitting: Family Medicine

## 2019-05-25 ENCOUNTER — Other Ambulatory Visit: Payer: Self-pay

## 2019-05-25 ENCOUNTER — Ambulatory Visit (INDEPENDENT_AMBULATORY_CARE_PROVIDER_SITE_OTHER): Payer: BLUE CROSS/BLUE SHIELD

## 2019-05-25 VITALS — BP 137/69 | HR 83 | Wt 225.0 lb

## 2019-05-25 DIAGNOSIS — R1011 Right upper quadrant pain: Secondary | ICD-10-CM

## 2019-05-25 LAB — POCT URINALYSIS DIPSTICK
Bilirubin, UA: NEGATIVE
Blood, UA: NEGATIVE
Color, UA: NEGATIVE
Glucose, UA: NEGATIVE
Ketones, UA: NEGATIVE
Leukocytes, UA: NEGATIVE
Nitrite, UA: NEGATIVE
Protein, UA: NEGATIVE
Spec Grav, UA: <=1.005 — AB (ref 1.010–1.025)
Urobilinogen, UA: 0.2 E.U./dL
pH, UA: 5.5 (ref 5.0–8.0)

## 2019-05-25 NOTE — Patient Instructions (Addendum)
Thank you for coming in today. Start very low fat or clear diet.  Get labs and ultrasound.  Recheck if not improving.  If labs or ultrasound indicate infection will do antibiotics.  Favor ibuprofen over tylenol for now.    Abdominal Pain, Adult Abdominal pain can be caused by many things. Often, abdominal pain is not serious and it gets better with no treatment or by being treated at home. However, sometimes abdominal pain is serious. Your health care provider will do a medical history and a physical exam to try to determine the cause of your abdominal pain. Follow these instructions at home:  Take over-the-counter and prescription medicines only as told by your health care provider. Do not take a laxative unless told by your health care provider.  Drink enough fluid to keep your urine clear or pale yellow.  Watch your condition for any changes.  Keep all follow-up visits as told by your health care provider. This is important. Contact a health care provider if:  Your abdominal pain changes or gets worse.  You are not hungry or you lose weight without trying.  You are constipated or have diarrhea for more than 2-3 days.  You have pain when you urinate or have a bowel movement.  Your abdominal pain wakes you up at night.  Your pain gets worse with meals, after eating, or with certain foods.  You are throwing up and cannot keep anything down.  You have a fever. Get help right away if:  Your pain does not go away as soon as your health care provider told you to expect.  You cannot stop throwing up.  Your pain is only in areas of the abdomen, such as the right side or the left lower portion of the abdomen.  You have bloody or black stools, or stools that look like tar.  You have severe pain, cramping, or bloating in your abdomen.  You have signs of dehydration, such as: ? Dark urine, very little urine, or no urine. ? Cracked lips. ? Dry mouth. ? Sunken  eyes. ? Sleepiness. ? Weakness. This information is not intended to replace advice given to you by your health care provider. Make sure you discuss any questions you have with your health care provider. Document Released: 06/03/2005 Document Revised: 03/13/2016 Document Reviewed: 02/05/2016 Elsevier Interactive Patient Education  [employer].

## 2019-05-25 NOTE — Progress Notes (Signed)
Nina Taylor is a 63 y.o. female who presents to [facility] today for right upper quadrent abd pain  Patient developed pain a few days ago.  She notes that her diet has been worsening recently because her kitchen is being remodeled and she is having to eat out.  She notes pain typically is worse after she eats.  She denies any vomiting or diarrhea.  She has a little constipation.  She denies any fevers or chills.  She denies any chest pain or shortness of breath.   Surgical history significant for lap cholecystectomy about 4 years ago.  She does have a history of diverticulitis and notes that her pain is not very consistent with that.  She also had COVID about a month ago but had recovered completely normally until symptoms today.   ROS as above:  Exam:  BP 137/69   Pulse 83   Wt 225 lb (102.1 kg)   SpO2 100%   BMI 34.21 kg/m  Wt Readings from Last 5 Encounters:  05/25/19 225 lb (102.1 kg)  04/24/19 213 lb (96.6 kg)  03/22/19 213 lb (96.6 kg)  12/20/18 209 lb (94.8 kg)  10/23/18 218 lb (98.9 kg)    Gen: Well NAD HEENT: EOMI,  MMM Lungs: Normal work of breathing. CTABL Heart: RRR no MRG Abd: NABS, Soft. Nondistended, liver edge palpable about 3 cm below right rib margin and tender to palpation.  Positive Murphy sign.  No rebound or guarding. Exts: Brisk capillary refill, warm and well perfused.   Lab and Radiology Results  Point-of-care urinalysis shows slightly low specific gravity but otherwise normal.   Assessment and Plan: 63 y.o. female with  Right upper quadrant abdominal pain.  Concerning for hepatitis.  Patient does have a history of cholecystectomy so gallstone is probably not a cause.  Differential does include gallstone in the common bile duct or pancreatitis or other.  Plan for lab work-up consisting of CBC metabolic panel lipase and acute  hepatitis panel.  Additionally will proceed with ultrasound abdomen.  If nondiagnostic next step would likely be contrast CT scan abdomen.  At this point I am not sure what the problem is so treatment is a bit challenging.  We will go ahead and start a low-fat diet with clear liquids.  Proceed with further treatment based on results.  PDMP not reviewed this encounter. Orders Placed This Encounter  Procedures  . US Abdomen Complete    Standing Status:   Future    Standing Expiration Date:   07/24/2020    Order Specific Question:   Reason for Exam (SYMPTOM  OR DIAGNOSIS REQUIRED)    Answer:   RUQ abd pain. S/p lap chole 4 years ago    Order Specific Question:   Preferred imaging location?    Answer:   [facility]  . COMPLETE METABOLIC PANEL WITH GFR  . CBC with Differential/Platelet  . Lipase  . Hepatitis panel, acute   No orders of the defined types were placed in this encounter.    Historical information moved to improve visibility of documentation.  Past Medical History:  Diagnosis Date  . Anxiety   . Coronary artery disease   . Depression   . GERD (gastroesophageal reflux disease)   . Heart attack [facility]) 2011   in [place], [place] with [facility]  . Pneumonia 2011  . PONV (postoperative nausea and vomiting)    Past Surgical History:  Procedure Laterality  Date  . CHOLECYSTECTOMY N/A 08/15/2014   Procedure: LAPAROSCOPIC CHOLECYSTECTOMY ;  Surgeon: Coralie Keens, MD;  Location: [facility];  Service: General;  Laterality: N/A;  Laparoscopic cholecystectomy   . CHOLECYSTECTOMY, LAPAROSCOPIC  08/15/2014  . CORONARY ANGIOPLASTY WITH STENT PLACEMENT  2011  . TUBAL LIGATION  1984   Social History   Tobacco Use  . Smoking status: Current Every Day Smoker    Packs/day: 0.25    Years: 20.00    Pack years: 5.00    Types: Cigarettes    Start date: 09/10/1993  . Smokeless tobacco: Never Used  Substance Use Topics  . Alcohol use: Yes    Alcohol/week: 0.0 standard drinks     Comment: wine rarely   family history includes Cancer in an other family member.  Medications: Current Outpatient Medications  Medication Sig Dispense Refill  . buPROPion (WELLBUTRIN XL) 300 MG 24 hr tablet Take 1 tablet (300 mg total) by mouth daily. 30 tablet 1  . fluticasone (FLONASE) 50 MCG/ACT nasal spray Place 2 sprays into both nostrils daily. 16 g 6  . albuterol (VENTOLIN HFA) 108 (90 Base) MCG/ACT inhaler Inhale 2 puffs into the lungs every 6 (six) hours as needed for wheezing or shortness of breath. (Patient not taking: Reported on 05/25/2019) 18 g 1  . azithromycin (ZITHROMAX Z-PAK) 250 MG tablet Take 2 tablets (500 mg) on  Day 1,  followed by 1 tablet (250 mg) once daily on Days 2 through 5. (Patient not taking: Reported on 05/25/2019) 6 tablet 0   No current facility-administered medications for this visit.    Allergies  Allergen Reactions  . Chantix [Varenicline]     nightmares  . Vicodin [Hydrocodone-Acetaminophen] Nausea And Vomiting     Discussed warning signs or symptoms. Please see discharge instructions. Patient expresses understanding.

## 2019-05-26 LAB — CBC WITH DIFFERENTIAL/PLATELET
Absolute Monocytes: 1293 cells/uL — ABNORMAL HIGH (ref 200–950)
Basophils Absolute: 61 cells/uL (ref 0–200)
Basophils Relative: 0.5 %
Eosinophils Absolute: 220 cells/uL (ref 15–500)
Eosinophils Relative: 1.8 %
HCT: 43.6 % (ref 35.0–45.0)
Hemoglobin: 14.1 g/dL (ref 11.7–15.5)
Lymphs Abs: 4477 cells/uL — ABNORMAL HIGH (ref 850–3900)
MCH: 29.1 pg (ref 27.0–33.0)
MCHC: 32.3 g/dL (ref 32.0–36.0)
MCV: 89.9 fL (ref 80.0–100.0)
MPV: 11 fL (ref 7.5–12.5)
Monocytes Relative: 10.6 %
Neutro Abs: 6149 cells/uL (ref 1500–7800)
Neutrophils Relative %: 50.4 %
Platelets: 229 10*3/uL (ref 140–400)
RBC: 4.85 10*6/uL (ref 3.80–5.10)
RDW: 12.7 % (ref 11.0–15.0)
Total Lymphocyte: 36.7 %
WBC: 12.2 10*3/uL — ABNORMAL HIGH (ref 3.8–10.8)

## 2019-05-26 LAB — COMPLETE METABOLIC PANEL WITH GFR
AG Ratio: 1.6 (calc) (ref 1.0–2.5)
ALT: 23 U/L (ref 6–29)
AST: 16 U/L (ref 10–35)
Albumin: 4.3 g/dL (ref 3.6–5.1)
Alkaline phosphatase (APISO): 82 U/L (ref 37–153)
BUN: 12 mg/dL (ref 7–25)
CO2: 22 mmol/L (ref 20–32)
Calcium: 9.5 mg/dL (ref 8.6–10.4)
Chloride: 106 mmol/L (ref 98–110)
Creat: 0.61 mg/dL (ref 0.50–0.99)
GFR, Est African American: 112 mL/min/{1.73_m2} (ref >=60)
GFR, Est Non African American: 96 mL/min/{1.73_m2} (ref >=60)
Globulin: 2.7 g/dL (calc) (ref 1.9–3.7)
Glucose, Bld: 101 mg/dL — ABNORMAL HIGH (ref 65–99)
Potassium: 4.2 mmol/L (ref 3.5–5.3)
Sodium: 139 mmol/L (ref 135–146)
Total Bilirubin: 0.2 mg/dL (ref 0.2–1.2)
Total Protein: 7 g/dL (ref 6.1–8.1)

## 2019-05-26 LAB — HEPATITIS PANEL, ACUTE
Hep A IgM: NONREACTIVE
Hep B C IgM: NONREACTIVE
Hepatitis B Surface Ag: NONREACTIVE
Hepatitis C Ab: NONREACTIVE
SIGNAL TO CUT-OFF: 0.15 (ref <1.00)

## 2019-05-26 LAB — LIPASE: Lipase: 29 U/L (ref 7–60)

## 2019-05-29 ENCOUNTER — Encounter: Payer: Self-pay | Admitting: Family Medicine

## 2019-05-29 ENCOUNTER — Telehealth: Payer: Self-pay | Admitting: Family Medicine

## 2019-05-29 DIAGNOSIS — R945 Abnormal results of liver function studies: Secondary | ICD-10-CM

## 2019-05-29 DIAGNOSIS — R932 Abnormal findings on diagnostic imaging of liver and biliary tract: Secondary | ICD-10-CM

## 2019-05-29 NOTE — Telephone Encounter (Signed)
Ultrasound abdomen shows concern for dilated pancreatic duct.  This could explain some of the pain.  Plan for MRI abdomen with MRCP.  This should be done with contrast.  You should hear about scheduling soon.

## 2019-05-29 NOTE — Telephone Encounter (Signed)
Left VM for Pt to return clinic call regarding results, callback information provided. 

## 2019-05-30 ENCOUNTER — Encounter: Payer: Self-pay | Admitting: Family Medicine

## 2019-05-30 MED ORDER — TRAMADOL HCL 50 MG PO TABS: 50.0000 mg | ORAL_TABLET | Freq: Three times a day (TID) | ORAL | 0 refills | Status: DC | PRN

## 2019-05-30 NOTE — Telephone Encounter (Signed)
-----   Message from Mertha Finders, [place] sent at 05/29/2019  9:40 AM EDT ----- Pt advised of results and recommendations, verbalized understanding. As per pt, continues to have abdominal pain. Currently taking ibuprofen. Requesting add'l recommendation from provider about minimizing pain level. Forwarding to provider for review.

## 2019-06-01 ENCOUNTER — Telehealth: Payer: Self-pay | Admitting: Family Medicine

## 2019-06-01 NOTE — Telephone Encounter (Signed)
Provider spoke with radiology and decided to change order to MRCP w wo contrast. authorization updated.   [id number] Valid 05/31/19-322/21

## 2019-06-01 NOTE — Telephone Encounter (Signed)
While completing prior authorization for MRI MRCP Abdomen I was advised the MRCP was without contrast and that is what the insurance company approved. The imaging department questions if the order should be an abdomen w wo? That would be a different code than the MRCP. Routing to see what order is appropriate, and if the PA needs to be changed.

## 2019-06-02 NOTE — Telephone Encounter (Signed)
Pt scheduled  

## 2019-06-02 NOTE — Telephone Encounter (Signed)
Thank you for your diligence Kelsi.  Radiology indicated that contrast needed to identify potential pancreatic cancer.  Patient should be scheduled soon.  Please update patient.  She can certainly call radiology at [phone] to schedule appointment now.

## 2019-06-06 ENCOUNTER — Other Ambulatory Visit: Payer: Self-pay

## 2019-06-06 ENCOUNTER — Ambulatory Visit (INDEPENDENT_AMBULATORY_CARE_PROVIDER_SITE_OTHER): Payer: BLUE CROSS/BLUE SHIELD

## 2019-06-06 ENCOUNTER — Encounter: Payer: Self-pay | Admitting: Family Medicine

## 2019-06-06 DIAGNOSIS — R945 Abnormal results of liver function studies: Secondary | ICD-10-CM

## 2019-06-06 DIAGNOSIS — R932 Abnormal findings on diagnostic imaging of liver and biliary tract: Secondary | ICD-10-CM | POA: Diagnosis not present

## 2019-06-06 DIAGNOSIS — R1011 Right upper quadrant pain: Secondary | ICD-10-CM

## 2019-06-06 DIAGNOSIS — K760 Fatty (change of) liver, not elsewhere classified: Secondary | ICD-10-CM | POA: Diagnosis not present

## 2019-06-06 MED ORDER — GADOBUTROL 1 MMOL/ML IV SOLN
10.0000 mL | Freq: Once | INTRAVENOUS | Status: AC | PRN
  Administered 2019-06-06: 10:00:00 10 mL via INTRAVENOUS

## 2019-06-07 ENCOUNTER — Encounter: Payer: Self-pay | Admitting: Gastroenterology

## 2019-06-10 ENCOUNTER — Other Ambulatory Visit: Payer: Self-pay | Admitting: Physician Assistant

## 2019-06-10 DIAGNOSIS — F419 Anxiety disorder, unspecified: Secondary | ICD-10-CM

## 2019-06-20 ENCOUNTER — Encounter: Payer: Self-pay | Admitting: Physician Assistant

## 2019-06-20 DIAGNOSIS — F419 Anxiety disorder, unspecified: Secondary | ICD-10-CM

## 2019-06-20 MED ORDER — LORAZEPAM 0.5 MG PO TABS: 0.5000 mg | ORAL_TABLET | Freq: Three times a day (TID) | ORAL | 0 refills | Status: DC | PRN

## 2019-06-22 ENCOUNTER — Other Ambulatory Visit: Payer: Self-pay | Admitting: Physician Assistant

## 2019-06-22 DIAGNOSIS — F419 Anxiety disorder, unspecified: Secondary | ICD-10-CM

## 2019-06-22 DIAGNOSIS — F330 Major depressive disorder, recurrent, mild: Secondary | ICD-10-CM

## 2019-06-23 ENCOUNTER — Ambulatory Visit: Payer: BLUE CROSS/BLUE SHIELD | Admitting: Gastroenterology

## 2019-06-27 ENCOUNTER — Ambulatory Visit: Payer: BLUE CROSS/BLUE SHIELD | Admitting: Gastroenterology

## 2019-07-14 ENCOUNTER — Ambulatory Visit: Payer: BLUE CROSS/BLUE SHIELD | Admitting: Gastroenterology

## 2019-07-19 ENCOUNTER — Telehealth: Payer: Self-pay

## 2019-07-19 NOTE — Telephone Encounter (Signed)
[facility] faxed request for medical records from Grover Canavan MD 07/19/19  [facility]

## 2019-08-09 ENCOUNTER — Encounter: Payer: Self-pay | Admitting: Gastroenterology

## 2019-08-09 ENCOUNTER — Other Ambulatory Visit: Payer: Self-pay

## 2019-08-09 ENCOUNTER — Telehealth (INDEPENDENT_AMBULATORY_CARE_PROVIDER_SITE_OTHER): Payer: BLUE CROSS/BLUE SHIELD | Admitting: Gastroenterology

## 2019-08-09 VITALS — Ht 69.0 in | Wt 213.0 lb

## 2019-08-09 DIAGNOSIS — Z1159 Encounter for screening for other viral diseases: Secondary | ICD-10-CM

## 2019-08-09 DIAGNOSIS — Z1211 Encounter for screening for malignant neoplasm of colon: Secondary | ICD-10-CM

## 2019-08-09 NOTE — Progress Notes (Signed)
Chief Complaint: for colon  Referring Provider:  Donella Stade, PA-C      ASSESSMENT AND PLAN;   #1. Colorectal cancer screening.  #2. IBS with bloating and diarrhea.  Better with diet control.  #3. Fatty liver with Nl LFTs. MRCP- neg for cirrhosis.  Highly motivated and has been able to reduce weight.  Plan: -Proceed with colonoscopy with miralax in Jan 2021 (daughter will come from [place]). Discussed risks & benefits. -Continue Gas-X on as needed basis for now.  Continue psyllium husk.  Continue current treatment. -Continue gradual weight loss as only treatment for fatty liver.  (Has been able to lose 42lb over last 1 year).  Encouraged her to continue avoiding fatty foods, high calorie drinks as she has been doing.  Continue current diet. -If with any problems, trial of Bentyl 10 mg p.o. twice daily. -I have reassured pt. -I have also reviewed MRCP films.    HPI:    Nina Taylor ([place]) is a 63 y.o. female  61 from [place] to take care of her aging sister. Intermittent abdominal pain LUQ with abdominal bloating followed by diarrhea.  If she travels she gets constipated.  Denies having any significant melena or hematochezia.  No nocturnal symptoms.  Abdominal bloating is better with Gas-X.  More problems with lettuce, kale, salads and avocados.  Not with cooked vegetables.  Never had colonoscopy.  Was scheduled in [place] but she canceled.  Had EGD 2017- showed erosive esophagitis and erosive gastritis.  Was on Dexilant 60 thereafter.  Stopped as it caused her to have constipation and she felt better after intentional weight loss.  Has been able to reduce 42lb over last 1 year.  She got highly motivated when she was told that she was "prediabetic".  No further heartburn.  Has not required Dexilant ever since.  Has also taken up intermittent fasting which has helped a lot.  No nonsteroidals, sodas, chocolates, mints, artificial sweeteners.  Had COVID-19 in August  2020-asymptomatic-since she was taking care of her sister, she had to quarantine in hotel.  Past GI work-up: -CT Abdo/pelvis September 2017: Fibroid uterus, diverticulosis, s/p cholecystectomy. -EGD 06/02/2016 [place] -grade B reflux esophagitis, few small ulcers in antrum, body of the stomach and fundus.  Mild duodenitis. Neg SB Bx for celiac, negative biopsies for H. pylori.  Negative GE junction biopsies for Barrett's. -[place] 05/2019 fatty liver. ?  Slight PD dilatation 3.7 mm.  Recommend MRCP. -MRCP 05/2019: Mild hepatic steatosis, no pancreatic mass/or ductal dilatation.  No biliary ductal dilatation.  Past Medical History:  Diagnosis Date  . Anxiety   . Coronary artery disease   . COVID-19 virus infection    Last week of 04/2019  . Depression   . Diverticulosis    dx in 2012  . GERD (gastroesophageal reflux disease)   . Heart attack [facility]) 2011   in [place], [place] with [facility]  . Pneumonia 2011  . PONV (postoperative nausea and vomiting)     Past Surgical History:  Procedure Laterality Date  . CHOLECYSTECTOMY N/A 08/15/2014   Procedure: LAPAROSCOPIC CHOLECYSTECTOMY ;  Surgeon: Coralie Keens, MD;  Location: [facility];  Service: General;  Laterality: N/A;  Laparoscopic cholecystectomy   . CHOLECYSTECTOMY, LAPAROSCOPIC  08/15/2014  . CORONARY ANGIOPLASTY WITH STENT PLACEMENT  2011   2 stents   . ESOPHAGOGASTRODUODENOSCOPY  05/27/2016  . TUBAL LIGATION  1984    Family History  Problem Relation Age of Onset  . Cancer Other   .  Cancer Mother        believes it was lung cancer that could have spreaded to her esophagus since she had trouble with her esophagus  . Colon cancer Neg Hx     Social History   Tobacco Use  . Smoking status: Current Every Day Smoker    Packs/day: 0.25    Years: 20.00    Pack years: 5.00    Types: Cigarettes    Start date: 09/10/1993  . Smokeless tobacco: Never Used  Substance Use Topics  . Alcohol use: Yes    Alcohol/week: 0.0 standard drinks     Comment: wine rarely  . Drug use: No    Current Outpatient Medications  Medication Sig Dispense Refill  . AMBULATORY NON FORMULARY MEDICATION 6 tablets daily. Medication Name: Ariix Supplements    . buPROPion (WELLBUTRIN XL) 300 MG 24 hr tablet Take 1 tablet (300 mg total) by mouth daily. NEEDS APPT 90 tablet 0  . fluticasone (FLONASE) 50 MCG/ACT nasal spray Place 2 sprays into both nostrils daily. (Patient taking differently: Place 2 sprays into both nostrils daily as needed. ) 16 g 6  . LORazepam (ATIVAN) 0.5 MG tablet Take 1 tablet (0.5 mg total) by mouth every 8 (eight) hours as needed. for anxiety (Patient taking differently: Take 0.5 mg by mouth as needed. for anxiety) 20 tablet 0  . Simethicone (GAS-X PO) Take by mouth as needed.    Marland Kitchen albuterol (VENTOLIN HFA) 108 (90 Base) MCG/ACT inhaler Inhale 2 puffs into the lungs every 6 (six) hours as needed for wheezing or shortness of breath. (Patient not taking: Reported on 05/25/2019) 18 g 1   No current facility-administered medications for this visit.     Allergies  Allergen Reactions  . Chantix [Varenicline]     nightmares  . Vicodin [Hydrocodone-Acetaminophen] Nausea And Vomiting    Review of Systems:  Constitutional: Denies fever, chills, diaphoresis, appetite change and fatigue.  HEENT: Denies photophobia, eye pain, redness, hearing loss, ear pain, congestion, sore throat, rhinorrhea, sneezing, mouth sores, neck pain, neck stiffness and tinnitus.   Respiratory: Denies SOB, DOE, cough, chest tightness,  and wheezing.   Cardiovascular: Denies chest pain, palpitations and leg swelling.  Genitourinary: Denies dysuria, urgency, frequency, hematuria, flank pain and difficulty urinating.  Musculoskeletal: Denies myalgias, back pain, joint swelling, arthralgias and gait problem.  Skin: No rash.  Neurological: Denies dizziness, seizures, syncope, weakness, light-headedness, numbness and headaches.  Hematological: Denies adenopathy. Easy  bruising, personal or family bleeding history  Psychiatric/Behavioral: Has anxiety, no depression     Physical Exam:    Ht 5\' 9"  (1.753 m)   Wt 213 lb (96.6 kg)   BMI 31.45 kg/m  Filed Weights   08/09/19 0952  Weight: 213 lb (96.6 kg)   Constitutional:  Well-developed, in no acute distress. Psychiatric: Normal mood and affect. Behavior is normal. Not examined since it was a televisit  Data Reviewed: I have personally reviewed following labs and imaging studies  CBC: CBC Latest Ref Rng & Units 05/25/2019 05/05/2019 03/15/2018  WBC 3.8 - 10.8 Thousand/uL 12.2(H) 11.7(H) 11.5(H)  Hemoglobin 11.7 - 15.5 g/dL 14.1 13.9 14.3  Hematocrit 35.0 - 45.0 % 43.6 43.1 43.6  Platelets 140 - 400 Thousand/uL 229 236 224    CMP: CMP Latest Ref Rng & Units 05/25/2019 05/05/2019 03/15/2018  Glucose 65 - 99 mg/dL 101(H) 106(H) 100(H)  BUN 7 - 25 mg/dL 12 18 20   Creatinine 0.50 - 0.99 mg/dL 0.61 0.67 0.74  Sodium 135 - 146  mmol/L 139 138 138  Potassium 3.5 - 5.3 mmol/L 4.2 4.4 4.3  Chloride 98 - 110 mmol/L 106 108 102  CO2 20 - 32 mmol/L 22 22 28   Calcium 8.6 - 10.4 mg/dL 9.5 9.4 9.8  Total Protein 6.1 - 8.1 g/dL 7.0 7.0 7.8  Total Bilirubin 0.2 - 1.2 mg/dL 0.2 0.3 0.3  Alkaline Phos 33 - 130 U/L - - -  AST 10 - 35 U/L 16 15 18   ALT 6 - 29 U/L 23 25 25   This service was provided via video telemedicine.  The patient was located at home.  The provider was located in office.  The patient did consent to this telephone visit and is aware of possible charges through their insurance for this visit.  The patient was referred by Iran Planas PA.   Time spent on call/coordination of care: 40 min    Carmell Austria, MD 08/09/2019, 2:46 PM  Cc: Donella Stade, PA-C

## 2019-08-09 NOTE — Patient Instructions (Addendum)
If you are age 63 or older, your body mass index should be between 23-30. Your Body mass index is 31.45 kg/m. If this is out of the aforementioned range listed, please consider follow up with your Primary Care Provider.  If you are age 67 or younger, your body mass index should be between 19-25. Your Body mass index is 31.45 kg/m. If this is out of the aformentioned range listed, please consider follow up with your Primary Care Provider.   You have been scheduled for a colonoscopy. Please follow written instructions given to you at your visit today.  Please pick up your prep supplies at the pharmacy within the next 1-3 days. If you use inhalers (even only as needed), please bring them with you on the day of your procedure. Your physician has requested that you go to www.startemmi.com and enter the access code given to you at your visit today. This web site gives a general overview about your procedure. However, you should still follow specific instructions given to you by our office regarding your preparation for the procedure.  Due to recent COVID-19 restrictions implemented by [employer] and state authorities and in an effort to keep both patients and staff as safe as possible, [facility] requires COVID-19 testing prior to any scheduled endoscopic procedure. The testing center is located at [address] [place], [place] [address] in the [facility] [employer]  suite.  Your appointment has been scheduled for 09/13/19 at 9:10am.   Please bring your insurance cards to this appointment. You will require your COVID screen 2 business days prior to your endoscopic procedure.  You are not required to quarantine after your screening.  You will only receive a phone call with the results if it is POSITIVE.  If you do not receive a call the day before your procedure you should begin your prep, if ordered, and you should report to the endo center for your  procedure at your designated appointment arrival time ( one hour prior to the procedure time). There is no cost to you for the screening on the day of the swab.  [facility] will file with your insurance company for the testing.    You may receive an automated phone call prior to your procedure or have a message in your MyChart that you have an appointment for a BP/15 at the [facility], please disregard this message.  Your testing will be at the [place] [place] location.   If you are leaving [facility] travel [place] on [address], turn left onto [address], turn night onto [address] at the 1st stop light turn right, pass the [employer] on your right and proceed to [place] (white building).    I have attached a Pre Procedure Patient Acknowledgement form and a prepaid envelope, please initial and sign form and mail back in envelope.    Thank you,  Dr. Jackquline Denmark

## 2019-09-11 ENCOUNTER — Encounter: Payer: Self-pay | Admitting: Gastroenterology

## 2019-09-13 ENCOUNTER — Ambulatory Visit (INDEPENDENT_AMBULATORY_CARE_PROVIDER_SITE_OTHER): Payer: BLUE CROSS/BLUE SHIELD

## 2019-09-13 ENCOUNTER — Other Ambulatory Visit: Payer: Self-pay | Admitting: Gastroenterology

## 2019-09-13 DIAGNOSIS — Z1159 Encounter for screening for other viral diseases: Secondary | ICD-10-CM

## 2019-09-13 LAB — SARS CORONAVIRUS 2 (TAT 6-24 HRS): SARS Coronavirus 2: NEGATIVE

## 2019-09-14 ENCOUNTER — Other Ambulatory Visit: Payer: Self-pay | Admitting: Physician Assistant

## 2019-09-14 DIAGNOSIS — F330 Major depressive disorder, recurrent, mild: Secondary | ICD-10-CM

## 2019-09-14 DIAGNOSIS — F419 Anxiety disorder, unspecified: Secondary | ICD-10-CM

## 2019-09-14 NOTE — Telephone Encounter (Signed)
Last written 06/20/2019 #20 with no refills.  Last appt for anxiety/depression 12/20/2018.

## 2019-09-15 ENCOUNTER — Encounter: Payer: Self-pay | Admitting: Gastroenterology

## 2019-09-15 ENCOUNTER — Ambulatory Visit (AMBULATORY_SURGERY_CENTER): Payer: BLUE CROSS/BLUE SHIELD | Admitting: Gastroenterology

## 2019-09-15 ENCOUNTER — Other Ambulatory Visit: Payer: Self-pay

## 2019-09-15 VITALS — BP 102/64 | HR 88 | Temp 98.3°F | Resp 16 | Ht 69.0 in | Wt 213.0 lb

## 2019-09-15 DIAGNOSIS — D123 Benign neoplasm of transverse colon: Secondary | ICD-10-CM | POA: Diagnosis not present

## 2019-09-15 DIAGNOSIS — Z1211 Encounter for screening for malignant neoplasm of colon: Secondary | ICD-10-CM | POA: Diagnosis not present

## 2019-09-15 DIAGNOSIS — D122 Benign neoplasm of ascending colon: Secondary | ICD-10-CM

## 2019-09-15 MED ORDER — SODIUM CHLORIDE 0.9 % IV SOLN: 500.0000 mL | Freq: Once | INTRAVENOUS | Status: DC

## 2019-09-15 NOTE — Progress Notes (Signed)
Called to room to assist during endoscopic procedure.  Patient ID and intended procedure confirmed with present staff. Received instructions for my participation in the procedure from the performing physician.  

## 2019-09-15 NOTE — Op Note (Signed)
[facility] Patient Name: Nina Taylor Procedure Date: 09/15/2019 9:20 AM MRN: [id number] Endoscopist: Jackquline Denmark , MD Age: 64 Referring MD:  Date of Birth: 1955-12-30 Gender: Female Account #: [id number] Procedure:                Colonoscopy Indications:              Screening for colorectal malignant neoplasm Medicines:                Monitored Anesthesia Care Procedure:                Pre-Anesthesia Assessment:                           - Prior to the procedure, a History and Physical                            was performed, and patient medications and                            allergies were reviewed. The patient's tolerance of                            previous anesthesia was also reviewed. The risks                            and benefits of the procedure and the sedation                            options and risks were discussed with the patient.                            All questions were answered, and informed consent                            was obtained. Prior Anticoagulants: The patient has                            taken no previous anticoagulant or antiplatelet                            agents. ASA Grade Assessment: II - A patient with                            mild systemic disease. After reviewing the risks                            and benefits, the patient was deemed in                            satisfactory condition to undergo the procedure.                           After obtaining informed consent, the colonoscope  was passed under direct vision. Throughout the                            procedure, the patient's blood pressure, pulse, and                            oxygen saturations were monitored continuously. The                            Colonoscope was introduced through the anus and                            advanced to the 2 cm into the ileum. The                            colonoscopy was performed  without difficulty. The                            patient tolerated the procedure well. The quality                            of the bowel preparation was adequate to identify                            polyps. The terminal ileum, ileocecal valve,                            appendiceal orifice, and rectum were photographed. Scope In: 9:26:06 AM Scope Out: [id number] AM Scope Withdrawal Time: 0 hours 13 minutes 44 seconds  Total Procedure Duration: 0 hours 20 minutes 38 seconds  Findings:                 Five sessile polyps were found in the transverse                            colon and mid ascending colon. The polyps were 6 to                            8 mm in size. These polyps were removed with a cold                            snare. Resection and retrieval were complete.                            Estimated blood loss: none.                           Multiple medium-mouthed diverticula were found in                            the sigmoid colon, descending colon, transverse                            colon and cecum.  Moderate hemorrhoids on                            retroflexed examination.                           The terminal ileum appeared normal.                           The terminal ileum appeared normal.                           The exam was otherwise without abnormality on                            direct and retroflexion views. Complications:            No immediate complications. Estimated Blood Loss:     Estimated blood loss: none. Impression:               -Colonic polyps s/p polypectomy.                           -Moderate pancolonic diverticulosis predominantly                            in the sigmoid colon.                           -Internal hemorrhoids.                           -Otherwise normal colonoscopy to TI. Recommendation:           - Patient has a contact number available for                            emergencies. The signs and symptoms of potential                             delayed complications were discussed with the                            patient. Return to normal activities tomorrow.                            Written discharge instructions were provided to the                            patient.                           - High-fiber diet.                           - Continue present medications.                           - Await pathology results.                           -  Repeat colonoscopy for surveillance based on                            pathology results.                           - Return to GI clinic PRN.                           - Left message for Josph Macho. Jackquline Denmark, MD 09/15/2019 9:55:39 AM This report has been signed electronically.

## 2019-09-15 NOTE — Patient Instructions (Signed)
Thank you for allowing [place] to care for you today!  Await pathology results by mail, approximately 2 weeks.  Will make recommendations for next colonoscopy at that time.  Continue with psyllium fiber with plenty of water.  Recommend high fiber diet as well.  Resume previous medications today, return to your normal activities tomorrow.    YOU HAD AN ENDOSCOPIC PROCEDURE TODAY AT [facility]:   Refer to the procedure report that was given to you for any specific questions about what was found during the examination.  If the procedure report does not answer your questions, please call your gastroenterologist to clarify.  If you requested that your care partner not be given the details of your procedure findings, then the procedure report has been included in a sealed envelope for you to review at your convenience later.  YOU SHOULD EXPECT: Some feelings of bloating in the abdomen. Passage of more gas than usual.  Walking can help get rid of the air that was put into your GI tract during the procedure and reduce the bloating. If you had a lower endoscopy (such as a colonoscopy or flexible sigmoidoscopy) you may notice spotting of blood in your stool or on the toilet paper. If you underwent a bowel prep for your procedure, you may not have a normal bowel movement for a few days.  Please Note:  You might notice some irritation and congestion in your nose or some drainage.  This is from the oxygen used during your procedure.  There is no need for concern and it should clear up in a day or so.  SYMPTOMS TO REPORT IMMEDIATELY:   Following lower endoscopy (colonoscopy or flexible sigmoidoscopy):  Excessive amounts of blood in the stool  Significant tenderness or worsening of abdominal pains  Swelling of the abdomen that is new, acute  Fever of 100F or higher    For urgent or emergent issues, a gastroenterologist can be reached at any hour by calling [phone].   DIET:  We  do recommend a small meal at first, but then you may proceed to your regular diet.  Drink plenty of fluids but you should avoid alcoholic beverages for 24 hours.  ACTIVITY:  You should plan to take it easy for the rest of today and you should NOT DRIVE or use heavy machinery until tomorrow (because of the sedation medicines used during the test).    FOLLOW UP: Our staff will call the number listed on your records 48-72 hours following your procedure to check on you and address any questions or concerns that you may have regarding the information given to you following your procedure. If we do not reach you, we will leave a message.  We will attempt to reach you two times.  During this call, we will ask if you have developed any symptoms of COVID 19. If you develop any symptoms (ie: fever, flu-like symptoms, shortness of breath, cough etc.) before then, please call [phone].  If you test positive for Covid 19 in the 2 weeks post procedure, please call and report this information to [place].    If any biopsies were taken you will be contacted by phone or by letter within the next 1-3 weeks.  Please call us at [phone] if you have not heard about the biopsies in 3 weeks.    SIGNATURES/CONFIDENTIALITY: You and/or your care partner have signed paperwork which will be entered into your electronic medical record.  These signatures attest to the  fact that that the information above on your After Visit Summary has been reviewed and is understood.  Full responsibility of the confidentiality of this discharge information lies with you and/or your care-partner.

## 2019-09-15 NOTE — Progress Notes (Signed)
Temp taken by [facility] VS taken by CW 

## 2019-09-15 NOTE — Progress Notes (Signed)
Report given to PACU, vss 

## 2019-09-19 ENCOUNTER — Encounter: Payer: Self-pay | Admitting: Gastroenterology

## 2019-09-19 ENCOUNTER — Telehealth: Payer: Self-pay

## 2019-09-19 ENCOUNTER — Telehealth: Payer: Self-pay | Admitting: *Deleted

## 2019-09-19 NOTE — Telephone Encounter (Signed)
  Follow up Call-  Call back number 09/15/2019  Post procedure Call Back phone  # [phone]  Permission to leave phone message Yes  Some recent data might be hidden     Patient questions:  Message left to call us if necessary.

## 2019-09-19 NOTE — Telephone Encounter (Signed)
  Follow up Call-  Call back number 09/15/2019  Post procedure Call Back phone  # [phone]  Permission to leave phone message Yes  Some recent data might be hidden     Patient questions:  Do you have a fever, pain , or abdominal swelling? No. Pain Score  0 *  Have you tolerated food without any problems? Yes.    Have you been able to return to your normal activities? Yes.    Do you have any questions about your discharge instructions: Diet   No. Medications  No. Follow up visit  No.  Do you have questions or concerns about your Care? Yes Pt has questions about what to do now, she states she is still having the same issues as pre-procedure and would like to talk to the doctor.  Actions: * If pain score is 4 or above: No action needed, pain <4.  1. Have you developed a fever since your procedure? no  2.   Have you had an respiratory symptoms (SOB or cough) since your procedure? no  3.   Have you tested positive for COVID 19 since your procedure no  4.   Have you had any family members/close contacts diagnosed with the COVID 19 since your procedure?  no   If yes to any of these questions please route to Joylene John, RN and Alphonsa Gin, Therapist, sports.

## 2019-09-21 ENCOUNTER — Telehealth: Payer: Self-pay | Admitting: Gastroenterology

## 2019-09-21 DIAGNOSIS — R634 Abnormal weight loss: Secondary | ICD-10-CM

## 2019-09-21 DIAGNOSIS — R140 Abdominal distension (gaseous): Secondary | ICD-10-CM

## 2019-09-21 DIAGNOSIS — R109 Unspecified abdominal pain: Secondary | ICD-10-CM

## 2019-09-21 NOTE — Telephone Encounter (Signed)
Called and spoke with patient-patient reports she is having continued gas/bloating and wanting to know what the next step in plan of care-and if there is something "anything that can be done for these symptoms";  Has been using Gas-X, psyllium husk as prescribed; patient is wanting to know if she can have the medicine the MD told her about to help with abd pain/bloating;  Please advise

## 2019-09-21 NOTE — Telephone Encounter (Signed)
Pt inquired about results of colonoscopy.  She also would like to discuss her symptoms and plan of care.

## 2019-09-22 NOTE — Telephone Encounter (Signed)
Attempted to reach patient-unable to leave a VM-will attempt to reach patient at a later date/time;  Orders for lab work and CT abd (to be done at [facility]) placed in [place]; [place] printed and mailed to patient;

## 2019-09-22 NOTE — Telephone Encounter (Signed)
Lets get -CBC, CMP, TSH, celiac screen and CRP -CT Abdo/pelvis with p.o. and IV contrast (RE: abdominal pain, bloating, weight loss) -FODMAP diet -FU after above.    RG

## 2019-09-25 NOTE — Telephone Encounter (Signed)
Called and spoke with patient-patient informed of MD recommendations; patient is agreeable with plan of care and has been sent a message in [place] with regards to lab work and CT scan; these orders have been placed in Epic; Patient will need to call [facility] radiology to schedule this CT as she is not sure she wants to proceed with this scan at this time;  Patient verbalized understanding of information/instructions;  Patient was advised to call the office at [phone] if questions/concerns arise;

## 2019-09-28 ENCOUNTER — Encounter: Payer: Self-pay | Admitting: Medical-Surgical

## 2019-09-28 ENCOUNTER — Ambulatory Visit (INDEPENDENT_AMBULATORY_CARE_PROVIDER_SITE_OTHER): Payer: BLUE CROSS/BLUE SHIELD | Admitting: Medical-Surgical

## 2019-09-28 VITALS — Temp 97.6°F

## 2019-09-28 DIAGNOSIS — H8110 Benign paroxysmal vertigo, unspecified ear: Secondary | ICD-10-CM | POA: Diagnosis not present

## 2019-09-28 DIAGNOSIS — H6690 Otitis media, unspecified, unspecified ear: Secondary | ICD-10-CM

## 2019-09-28 MED ORDER — AMOXICILLIN-POT CLAVULANATE 875-125 MG PO TABS: 1.0000 | ORAL_TABLET | Freq: Two times a day (BID) | ORAL | 0 refills | Status: AC

## 2019-09-28 MED ORDER — MECLIZINE HCL 25 MG PO TABS: 25.0000 mg | ORAL_TABLET | Freq: Three times a day (TID) | ORAL | 3 refills | Status: DC | PRN

## 2019-09-28 NOTE — Progress Notes (Addendum)
Virtual Visit via Video Note  I connected with Nina Taylor on 09/28/19 at 11:10 AM EST by a video enabled telemedicine application and verified that I am speaking with the correct person using two identifiers.   I discussed the limitations of evaluation and management by telemedicine and the availability of in person appointments. The patient expressed understanding and agreed to proceed.  Subjective:    CC: Dizziness, nausea  HPI:  64 year old female presenting today with reports of dizziness and nausea that started last night and worsened this morning.  Reports headache, chills, nausea with vomiting, weakness/fatigue, chronic sinus congestion, and bilateral ear discomfort.  Feels as if the room is spinning with position changes.  Has been vomiting since 3 AM and is unable to hold down food or liquid.  Has had ear pressure/discomfort for 2 days.  Had a similar episode last February and was treated for otitis media/BPPV at urgent care.  No shortness of breath, body aches, fever, cough, congestion, loss of taste/smell, facial/eye pain/pressure, runny nose, postnasal drip, or sore throat.  Past medical history, Surgical history, Family history not pertinant except as noted below, Social history, Allergies, and medications have been entered into the medical record, reviewed, and corrections made.   Review of Systems: No fevers, night sweats, weight loss, chest pain, or shortness of breath.   Objective:    General: Speaking clearly in complete sentences without any shortness of breath.  Alert and oriented x3.  Normal judgment. No apparent acute distress.    Impression and Recommendations:    BPPV Meclizine 25 mg 3 times daily as needed for nausea/dizziness.  Same presentation during her urgent care visit February 2020 so will treat empirically for otitis media.  Some concern with being able to tolerate p.o. medications with her current level of nausea.  May need to come to the office/urgent  care for an injection if unable to tolerate initial dose of meclizine and antibiotic.  Ear discomfort Treating empirically with Augmentin twice daily x10 days.   Return if symptoms worsen or fail to improve.  I discussed the assessment and treatment plan with the patient. The patient was provided an opportunity to ask questions and all were answered. The patient agreed with the plan and demonstrated an understanding of the instructions.   The patient was advised to call back or seek an in-person evaluation if the symptoms worsen or if the condition fails to improve as anticipated.  35 minutes of non-face-to-face time was provided during this encounter.   Clearnce Sorrel, DNP, APRN, FNP-BC [facility]

## 2019-09-28 NOTE — Telephone Encounter (Signed)
We had looked at the previous CT report from 2017 It did show some inflammation of the colon.  Since she is having symptoms, that is why we are proceeding with labs and CT as per previous note. Please let her know.  Thx  RG

## 2019-10-15 ENCOUNTER — Other Ambulatory Visit: Payer: Self-pay | Admitting: Physician Assistant

## 2019-10-15 DIAGNOSIS — F330 Major depressive disorder, recurrent, mild: Secondary | ICD-10-CM

## 2019-10-25 ENCOUNTER — Encounter: Payer: Self-pay | Admitting: Physician Assistant

## 2019-10-25 DIAGNOSIS — F439 Reaction to severe stress, unspecified: Secondary | ICD-10-CM

## 2019-10-25 DIAGNOSIS — F419 Anxiety disorder, unspecified: Secondary | ICD-10-CM

## 2019-10-27 DIAGNOSIS — F439 Reaction to severe stress, unspecified: Secondary | ICD-10-CM | POA: Insufficient documentation

## 2019-11-22 ENCOUNTER — Encounter: Payer: Self-pay | Admitting: Physician Assistant

## 2019-11-22 ENCOUNTER — Other Ambulatory Visit: Payer: Self-pay | Admitting: Physician Assistant

## 2019-11-22 DIAGNOSIS — F330 Major depressive disorder, recurrent, mild: Secondary | ICD-10-CM

## 2019-11-27 ENCOUNTER — Ambulatory Visit: Payer: BLUE CROSS/BLUE SHIELD | Admitting: Physician Assistant

## 2019-12-01 ENCOUNTER — Ambulatory Visit (INDEPENDENT_AMBULATORY_CARE_PROVIDER_SITE_OTHER): Payer: BLUE CROSS/BLUE SHIELD | Admitting: Physician Assistant

## 2019-12-01 ENCOUNTER — Encounter: Payer: Self-pay | Admitting: Physician Assistant

## 2019-12-01 ENCOUNTER — Other Ambulatory Visit: Payer: Self-pay

## 2019-12-01 VITALS — BP 114/73 | HR 76 | Ht 68.0 in | Wt 222.0 lb

## 2019-12-01 DIAGNOSIS — F419 Anxiety disorder, unspecified: Secondary | ICD-10-CM | POA: Diagnosis not present

## 2019-12-01 DIAGNOSIS — F331 Major depressive disorder, recurrent, moderate: Secondary | ICD-10-CM | POA: Diagnosis not present

## 2019-12-01 DIAGNOSIS — Z1231 Encounter for screening mammogram for malignant neoplasm of breast: Secondary | ICD-10-CM

## 2019-12-01 MED ORDER — VIIBRYD 40 MG PO TABS: 40.0000 mg | ORAL_TABLET | Freq: Every day | ORAL | 0 refills | Status: DC

## 2019-12-01 MED ORDER — LORAZEPAM 0.5 MG PO TABS: 0.5000 mg | ORAL_TABLET | Freq: Three times a day (TID) | ORAL | 1 refills | Status: DC | PRN

## 2019-12-01 MED ORDER — VIIBRYD 20 MG PO TABS: 1.0000 | ORAL_TABLET | Freq: Every day | ORAL | 0 refills | Status: DC

## 2019-12-01 NOTE — Progress Notes (Signed)
Subjective:    Patient ID: Nina Taylor, female    DOB: April 01, 1956, 64 y.o.   MRN: [id number]  HPI  Pt is a 64 yo female with MDD, anxiety who presents to the clinic for medication follow up. She had to switch to wellbutrin last year due to insurance and cost. She has not done well on wellbutrin. She is more depressed and anxious. 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 She does have counseling set up for next week. She wants to go back on viibryd. She has new insurance now. She declines any regular exercise. No SI/HC.   Marland Kitchen. Active Ambulatory Problems    Diagnosis Date Noted  . GERD (gastroesophageal reflux disease) 05/23/2014  . Heart attack ([place]) 05/23/2014  . CAD (coronary artery disease) 05/23/2014  . Depression 05/23/2014  . Diverticulitis 05/23/2014  . History of diverticulitis 05/25/2014  . Calculus of gallbladder 05/25/2014  . Cerumen impaction 10/23/2014  . Swelling of foot joint, left 01/10/2017  . Left breast mass 04/19/2017  . Tobacco abuse 05/04/2017  . Essential hypertension 05/18/2017  . Elevated fasting glucose 05/18/2017  . Anxiety 11/23/2017  . Class 2 obesity due to excess calories without serious comorbidity with body mass index (BMI) of 35.0 to 35.9 in adult 03/15/2018  . No energy 03/21/2018  . Stress 10/27/2019  . Moderate episode of recurrent major depressive disorder ([place]) 12/01/2019   Resolved Ambulatory Problems    Diagnosis Date Noted  . Morbid obesity ([place]) 01/10/2017   Past Medical History:  Diagnosis Date  . Allergy   . Coronary artery disease   . COVID-19 virus infection   . Diverticulosis   . Pneumonia 2011  . PONV (postoperative nausea and vomiting)      Review of Systems See HPI.     Objective:   Physical Exam Vitals reviewed.  Constitutional:      Appearance: Normal appearance. She is obese.  Neurological:     General: No focal deficit present.     Mental Status: She is alert and oriented to  person, place, and time.  Psychiatric:        Mood and Affect: Mood normal.    .. Depression screen [facility] 2/9 12/01/2019 03/22/2019 12/20/2018 03/15/2018 05/18/2017  Decreased Interest 2 1 2 1  0  Down, Depressed, Hopeless 2 0 2 1 0  PHQ - 2 Score 4 1 4 2  0  Altered sleeping 2 0 2 1 0  Tired, decreased energy 2 0 0 1 1  Change in appetite 2 3 0 3 0  Feeling bad or failure about yourself  2 0 0 0 0  Trouble concentrating 3 3 3 1  0  Moving slowly or fidgety/restless 3 3 3 2  0  Suicidal thoughts 0 0 0 0 0  PHQ-9 Score 18 10 12 10 1   Difficult doing work/chores Very difficult Very difficult Extremely dIfficult Somewhat difficult Not difficult at all   .Marland Kitchen GAD 7 : Generalized Anxiety Score 12/01/2019 03/22/2019 12/20/2018 03/15/2018  Nervous, Anxious, on Edge 1 3 3 1   Control/stop worrying 2 3 3 1   Worry too much - different things 2 3 3 1   Trouble relaxing 3 3 3 2   Restless 3 3 3 2   Easily annoyed or irritable 3 2 3 1   Afraid - awful might happen 1 2 3 1   Total GAD 7 Score 15 19 21 9   Anxiety Difficulty Very difficult Somewhat difficult Very difficult Somewhat difficult  Assessment & Plan:  Marland KitchenMarland KitchenJaimee was seen today for depression.  Diagnoses and all orders for this visit:  Moderate episode of recurrent major depressive disorder (HCC) -     Vilazodone HCl (VIIBRYD) 20 MG TABS; Take 1 tablet (20 mg total) by mouth at bedtime. -     Vilazodone HCl (VIIBRYD) 40 MG TABS; Take 1 tablet (40 mg total) by mouth daily.  Anxiety -     Vilazodone HCl (VIIBRYD) 20 MG TABS; Take 1 tablet (20 mg total) by mouth at bedtime. -     Vilazodone HCl (VIIBRYD) 40 MG TABS; Take 1 tablet (40 mg total) by mouth daily. -     LORazepam (ATIVAN) 0.5 MG tablet; Take 1 tablet (0.5 mg total) by mouth every 8 (eight) hours as needed. for anxiety  Visit for screening mammogram -     MM 3D SCREEN BREAST BILATERAL   Will transition back to viibryd. 1/2 tablet of wellbutrin and 1/2 tablet for viibryd 20mg   for 7 days. Full tablet and viibryd 20mg  and stop wellbutrin for 1 month. Then start 40mg  viibryd daily. Get in with counseling. Walk 30 minutes daily. Choose healthy food options. Follow up in 3 months.

## 2019-12-04 ENCOUNTER — Ambulatory Visit (INDEPENDENT_AMBULATORY_CARE_PROVIDER_SITE_OTHER): Payer: BLUE CROSS/BLUE SHIELD | Admitting: Professional

## 2019-12-04 DIAGNOSIS — F411 Generalized anxiety disorder: Secondary | ICD-10-CM | POA: Diagnosis not present

## 2019-12-18 ENCOUNTER — Telehealth: Payer: Self-pay | Admitting: Neurology

## 2019-12-18 NOTE — Telephone Encounter (Signed)
-----   Message from Donella Stade, [place] sent at 12/18/2019 10:12 AM EDT ----- Regarding: FW: Mammo Can we find out from patient if she spoke with Dr. Derrel Nip about removal that was suggested?  ----- Message ----- From: Antonietta Barcelona Sent: 12/17/2019   7:17 AM EDT To: Donella Stade, PA-C Subject: Mammo                                          ADDENDUM REPORT: 04/23/2017 11:18  ADDENDUM: Pathology revealed hyaline fibrosis with mild patchy chronic inflammation in the LEFT breast. This was found to be discordant by Dr. Marin Olp and excision is recommended. Pathology results were discussed with the patient by telephone. The patient reported doing well after the biopsy. Post biopsy instructions and care were reviewed and questions were answered. The patient was encouraged to call [facility] for any additional concerns. Surgical consultation has been arranged with Dr. Nedra Hai at [facility] on May 13, 2017.  Pathology results reported by Susa Raring RN, BSN on 04/23/2017.   Electronically Signed   By: Marin Olp M.D.   On: 04/23/2017 11:18  Addended by Marin Olp, MD on 04/23/2017 12:21 PM    Good Morning,  Just wondering if the above pt had the consultation Sept 6, 2018. Per Dr. Derrel Nip he did not agree with the pathology so he didn't call it benign and he recommended removal. Just trying to figure out so we can get her scheduled properly.   Thanks. Junie Panning

## 2019-12-19 NOTE — Telephone Encounter (Signed)
Spoke with patient and she did not see the surgeon and does not want surgery. [place].

## 2019-12-29 ENCOUNTER — Ambulatory Visit (INDEPENDENT_AMBULATORY_CARE_PROVIDER_SITE_OTHER): Payer: BLUE CROSS/BLUE SHIELD | Admitting: Professional

## 2019-12-29 DIAGNOSIS — F411 Generalized anxiety disorder: Secondary | ICD-10-CM

## 2020-01-08 ENCOUNTER — Ambulatory Visit: Payer: BLUE CROSS/BLUE SHIELD | Admitting: Professional

## 2020-01-17 ENCOUNTER — Ambulatory Visit: Payer: BLUE CROSS/BLUE SHIELD | Admitting: Professional

## 2020-01-19 ENCOUNTER — Ambulatory Visit: Payer: BLUE CROSS/BLUE SHIELD | Admitting: Professional

## 2020-01-25 ENCOUNTER — Ambulatory Visit (INDEPENDENT_AMBULATORY_CARE_PROVIDER_SITE_OTHER): Payer: BLUE CROSS/BLUE SHIELD | Admitting: Professional

## 2020-01-25 DIAGNOSIS — F411 Generalized anxiety disorder: Secondary | ICD-10-CM

## 2020-01-30 ENCOUNTER — Ambulatory Visit (INDEPENDENT_AMBULATORY_CARE_PROVIDER_SITE_OTHER): Payer: BLUE CROSS/BLUE SHIELD | Admitting: Professional

## 2020-01-30 DIAGNOSIS — F411 Generalized anxiety disorder: Secondary | ICD-10-CM

## 2020-02-08 ENCOUNTER — Ambulatory Visit: Payer: BLUE CROSS/BLUE SHIELD | Admitting: Professional

## 2020-02-22 ENCOUNTER — Ambulatory Visit (INDEPENDENT_AMBULATORY_CARE_PROVIDER_SITE_OTHER): Payer: BLUE CROSS/BLUE SHIELD | Admitting: Professional

## 2020-02-22 DIAGNOSIS — F411 Generalized anxiety disorder: Secondary | ICD-10-CM

## 2020-02-29 ENCOUNTER — Ambulatory Visit: Payer: BLUE CROSS/BLUE SHIELD | Admitting: Professional

## 2020-03-05 ENCOUNTER — Ambulatory Visit: Payer: BLUE CROSS/BLUE SHIELD | Admitting: Professional

## 2020-03-22 ENCOUNTER — Other Ambulatory Visit: Payer: Self-pay | Admitting: Family Medicine

## 2020-03-22 DIAGNOSIS — F419 Anxiety disorder, unspecified: Secondary | ICD-10-CM

## 2020-03-22 DIAGNOSIS — F331 Major depressive disorder, recurrent, moderate: Secondary | ICD-10-CM

## 2020-04-01 ENCOUNTER — Telehealth: Payer: Self-pay | Admitting: Physician Assistant

## 2020-04-01 DIAGNOSIS — Z0000 Encounter for general adult medical examination without abnormal findings: Secondary | ICD-10-CM

## 2020-04-01 NOTE — Telephone Encounter (Signed)
Patient would like blood work sent in before physical so PCP can have that to go over at appointment. Patient is aware that PCP is out this whole week.

## 2020-04-03 NOTE — Telephone Encounter (Signed)
Routing to covering provider. Labs pended for provider's review.

## 2020-04-04 NOTE — Telephone Encounter (Signed)
done

## 2020-04-05 NOTE — Telephone Encounter (Signed)
Patient advised.

## 2020-04-06 ENCOUNTER — Encounter: Payer: Self-pay | Admitting: Physician Assistant

## 2020-04-07 DIAGNOSIS — Z20822 Contact with and (suspected) exposure to covid-19: Secondary | ICD-10-CM | POA: Diagnosis not present

## 2020-04-12 ENCOUNTER — Encounter: Payer: BLUE CROSS/BLUE SHIELD | Admitting: Physician Assistant

## 2020-04-23 DIAGNOSIS — F4321 Adjustment disorder with depressed mood: Secondary | ICD-10-CM | POA: Diagnosis not present

## 2020-04-30 DIAGNOSIS — L578 Other skin changes due to chronic exposure to nonionizing radiation: Secondary | ICD-10-CM | POA: Diagnosis not present

## 2020-04-30 LAB — LIPID PANEL W/REFLEX DIRECT LDL
Cholesterol: 200 mg/dL — ABNORMAL HIGH (ref <200)
HDL: 47 mg/dL — ABNORMAL LOW (ref >=50)
LDL Cholesterol (Calc): 125 mg/dL (calc) — ABNORMAL HIGH
Non-HDL Cholesterol (Calc): 153 mg/dL (calc) — ABNORMAL HIGH (ref <130)
Total CHOL/HDL Ratio: 4.3 (calc) (ref <5.0)
Triglycerides: 168 mg/dL — ABNORMAL HIGH (ref <150)

## 2020-04-30 LAB — CBC
HCT: 46 % — ABNORMAL HIGH (ref 35.0–45.0)
Hemoglobin: 15 g/dL (ref 11.7–15.5)
MCH: 29.6 pg (ref 27.0–33.0)
MCHC: 32.6 g/dL (ref 32.0–36.0)
MCV: 90.7 fL (ref 80.0–100.0)
MPV: 11 fL (ref 7.5–12.5)
Platelets: 224 10*3/uL (ref 140–400)
RBC: 5.07 10*6/uL (ref 3.80–5.10)
RDW: 12.7 % (ref 11.0–15.0)
WBC: 11.5 10*3/uL — ABNORMAL HIGH (ref 3.8–10.8)

## 2020-04-30 LAB — COMPLETE METABOLIC PANEL WITH GFR
AG Ratio: 1.5 (calc) (ref 1.0–2.5)
ALT: 32 U/L — ABNORMAL HIGH (ref 6–29)
AST: 19 U/L (ref 10–35)
Albumin: 4.5 g/dL (ref 3.6–5.1)
Alkaline phosphatase (APISO): 102 U/L (ref 37–153)
BUN: 15 mg/dL (ref 7–25)
CO2: 23 mmol/L (ref 20–32)
Calcium: 9.5 mg/dL (ref 8.6–10.4)
Chloride: 106 mmol/L (ref 98–110)
Creat: 0.55 mg/dL (ref 0.50–0.99)
GFR, Est African American: 115 mL/min/{1.73_m2} (ref >=60)
GFR, Est Non African American: 99 mL/min/{1.73_m2} (ref >=60)
Globulin: 3.1 g/dL (calc) (ref 1.9–3.7)
Glucose, Bld: 98 mg/dL (ref 65–99)
Potassium: 4.2 mmol/L (ref 3.5–5.3)
Sodium: 138 mmol/L (ref 135–146)
Total Bilirubin: 0.3 mg/dL (ref 0.2–1.2)
Total Protein: 7.6 g/dL (ref 6.1–8.1)

## 2020-05-02 DIAGNOSIS — F4321 Adjustment disorder with depressed mood: Secondary | ICD-10-CM | POA: Diagnosis not present

## 2020-05-03 ENCOUNTER — Encounter: Payer: Self-pay | Admitting: Physician Assistant

## 2020-05-03 DIAGNOSIS — E6609 Other obesity due to excess calories: Secondary | ICD-10-CM

## 2020-05-03 NOTE — Telephone Encounter (Signed)
Viv,   Kidney and sguars look great.  One liver enzyme just a tad elevated. Stop alcohol and tylenol and recheck in 2 weeks.  Your cholesterol is up from 1 year ago and with smoking, coronary artery disease, and history of heart attack. I very strongly recommend a statin to help reduce your cardiovascular risk. I also recommend an 81mg  ASA a day.

## 2020-05-07 ENCOUNTER — Ambulatory Visit: Payer: BLUE CROSS/BLUE SHIELD | Admitting: Physician Assistant

## 2020-05-07 NOTE — Telephone Encounter (Signed)
Ok to take her off schedule as to not charge

## 2020-05-09 ENCOUNTER — Telehealth: Payer: Self-pay

## 2020-05-09 NOTE — Telephone Encounter (Signed)
I have reviewed her colonoscopy report from January 2021  Not so sure if she has diverticulitis.  Absence of fever goes against it.  So, at this time lets hold off on antibiotics  She does have IBS with constipation  Plan: -MiraLAX 17 g p.o. once a day -Continue Metamucil -Increase water intake. -Let us know how she is in the next 3 to 4 days.  She is to let us know if she has any new problems including fever.  RG

## 2020-05-09 NOTE — Telephone Encounter (Signed)
Spoke to patient. She will start Miralax 17 g p.o. once daily. Continue Miralax and increase her waster intake. Patient will call us in a few days to report her response.Sooner if she develops a fever.All questions answered. Patient voiced understanding.

## 2020-05-09 NOTE — Telephone Encounter (Signed)
Spoke to patient who has a history of Diverticulosis. She reports over the past 3 days an increase of left sided abdominal pain, no fever. She had a bowel movement 3 days ago. Patient takes Metamucil daily. She would like to try miralax to see if that will help her symptoms. Dr Lyndel Safe do you have any recommendations to help control her symptoms? Please advise

## 2020-05-22 DIAGNOSIS — F4321 Adjustment disorder with depressed mood: Secondary | ICD-10-CM | POA: Diagnosis not present

## 2020-05-24 NOTE — Telephone Encounter (Signed)
Pt requesting weight mgmt referral. Pended.

## 2020-05-27 ENCOUNTER — Other Ambulatory Visit: Payer: Self-pay | Admitting: Gastroenterology

## 2020-05-27 ENCOUNTER — Telehealth: Payer: Self-pay

## 2020-05-27 DIAGNOSIS — R140 Abdominal distension (gaseous): Secondary | ICD-10-CM

## 2020-05-27 DIAGNOSIS — R109 Unspecified abdominal pain: Secondary | ICD-10-CM

## 2020-05-27 NOTE — Telephone Encounter (Signed)
Pt has questions about food list?

## 2020-05-27 NOTE — Telephone Encounter (Signed)
Spoke to patient who called to report continued gas,bloating and abdominal pain. Her symptoms have been ongoing for several months. She has added miralax 17 g daily for IBS with constipation. Patient was wondering if any further testing can be done. She was ordered to have CT Abd/Pelvis with contrast and labs done 09/21/19 but never did. -CBC, CMP, TSH, celiac screen and CRP -CT Abdo/pelvis with p.o. and IV contrast (RE: abdominal pain, bloating, weight loss) -FODMAP diet -FU after above. The above has been ordered except CBC as it was just done last month at her PCP. Patient has agreed to have CT and labs done. A FODMAP diet sent through MyChart and follow up appointment with Dr Lyndel Safe for next available scheduled.

## 2020-05-27 NOTE — Telephone Encounter (Signed)
Spoke to patient. All questions answered. Patient voiced understanding. 

## 2020-05-28 ENCOUNTER — Encounter: Payer: Self-pay | Admitting: Physician Assistant

## 2020-05-28 ENCOUNTER — Encounter (INDEPENDENT_AMBULATORY_CARE_PROVIDER_SITE_OTHER): Payer: Self-pay | Admitting: Family Medicine

## 2020-05-28 ENCOUNTER — Other Ambulatory Visit (INDEPENDENT_AMBULATORY_CARE_PROVIDER_SITE_OTHER): Payer: BLUE CROSS/BLUE SHIELD

## 2020-05-28 DIAGNOSIS — R140 Abdominal distension (gaseous): Secondary | ICD-10-CM | POA: Diagnosis not present

## 2020-05-28 DIAGNOSIS — R634 Abnormal weight loss: Secondary | ICD-10-CM | POA: Diagnosis not present

## 2020-05-28 DIAGNOSIS — R109 Unspecified abdominal pain: Secondary | ICD-10-CM | POA: Diagnosis not present

## 2020-05-28 LAB — COMPREHENSIVE METABOLIC PANEL
ALT: 29 U/L (ref 0–35)
AST: 17 U/L (ref 0–37)
Albumin: 4.4 g/dL (ref 3.5–5.2)
Alkaline Phosphatase: 101 U/L (ref 39–117)
BUN: 14 mg/dL (ref 6–23)
CO2: 21 mEq/L (ref 19–32)
Calcium: 9.4 mg/dL (ref 8.4–10.5)
Chloride: 107 mEq/L (ref 96–112)
Creatinine, Ser: 0.6 mg/dL (ref 0.40–1.20)
GFR: 100.42 mL/min (ref >60.00)
Glucose, Bld: 103 mg/dL — ABNORMAL HIGH (ref 70–99)
Potassium: 4.1 mEq/L (ref 3.5–5.1)
Sodium: 135 mEq/L (ref 135–145)
Total Bilirubin: 0.3 mg/dL (ref 0.2–1.2)
Total Protein: 7.8 g/dL (ref 6.0–8.3)

## 2020-05-28 LAB — TSH: TSH: 2.5 u[IU]/mL (ref 0.35–4.50)

## 2020-05-28 LAB — HIGH SENSITIVITY CRP: CRP, High Sensitivity: 8.93 mg/L — ABNORMAL HIGH (ref 0.000–5.000)

## 2020-05-28 LAB — C-REACTIVE PROTEIN: CRP: <1 mg/dL (ref 0.5–20.0)

## 2020-05-30 LAB — CELIAC PANEL 10
Antigliadin Abs, IgA: 13 units (ref 0–19)
Endomysial IgA: NEGATIVE
Gliadin IgG: 2 units (ref 0–19)
IgA/Immunoglobulin A, Serum: 263 mg/dL (ref 87–352)
Tissue Transglut Ab: <2 U/mL (ref 0–5)
Transglutaminase IgA: <2 U/mL (ref 0–3)

## 2020-05-30 LAB — TISSUE TRANSGLUTAMINASE, IGA: (tTG) Ab, IgA: <1 U/mL

## 2020-05-30 LAB — RETICULIN ANTIBODIES, IGA W TITER: Reticulin IgA Screen: NEGATIVE

## 2020-05-31 ENCOUNTER — Telehealth: Payer: Self-pay | Admitting: Gastroenterology

## 2020-05-31 NOTE — Telephone Encounter (Signed)
Spoke to patient who reports feeling better since starting the low LODMAP diet. She has an upcoming CT of the abdomen and plevis as part of her work up. She will be notified of the results once MD reviews.All questions answered,patient voiced understanding.

## 2020-05-31 NOTE — Telephone Encounter (Signed)
Pt called stating that she was returning your call. Pls call her again.  

## 2020-06-03 ENCOUNTER — Encounter (HOSPITAL_BASED_OUTPATIENT_CLINIC_OR_DEPARTMENT_OTHER): Payer: Self-pay

## 2020-06-03 ENCOUNTER — Ambulatory Visit (HOSPITAL_BASED_OUTPATIENT_CLINIC_OR_DEPARTMENT_OTHER): Payer: BLUE CROSS/BLUE SHIELD

## 2020-06-05 DIAGNOSIS — F4321 Adjustment disorder with depressed mood: Secondary | ICD-10-CM | POA: Diagnosis not present

## 2020-06-11 ENCOUNTER — Other Ambulatory Visit: Payer: Self-pay | Admitting: Family Medicine

## 2020-06-11 DIAGNOSIS — F419 Anxiety disorder, unspecified: Secondary | ICD-10-CM

## 2020-06-11 DIAGNOSIS — F331 Major depressive disorder, recurrent, moderate: Secondary | ICD-10-CM

## 2020-06-12 MED ORDER — VIIBRYD 40 MG PO TABS: 40.0000 mg | ORAL_TABLET | Freq: Every day | ORAL | 0 refills | Status: DC

## 2020-06-13 DIAGNOSIS — R1032 Left lower quadrant pain: Secondary | ICD-10-CM | POA: Diagnosis not present

## 2020-06-13 DIAGNOSIS — K580 Irritable bowel syndrome with diarrhea: Secondary | ICD-10-CM | POA: Diagnosis not present

## 2020-06-17 ENCOUNTER — Encounter (INDEPENDENT_AMBULATORY_CARE_PROVIDER_SITE_OTHER): Payer: Self-pay | Admitting: Family Medicine

## 2020-06-17 ENCOUNTER — Other Ambulatory Visit: Payer: Self-pay

## 2020-06-17 ENCOUNTER — Ambulatory Visit (INDEPENDENT_AMBULATORY_CARE_PROVIDER_SITE_OTHER): Payer: BLUE CROSS/BLUE SHIELD | Admitting: Family Medicine

## 2020-06-17 VITALS — BP 114/76 | HR 83 | Temp 98.0°F | Ht 66.0 in | Wt 218.0 lb

## 2020-06-17 DIAGNOSIS — Z0289 Encounter for other administrative examinations: Secondary | ICD-10-CM

## 2020-06-17 DIAGNOSIS — R5383 Other fatigue: Secondary | ICD-10-CM

## 2020-06-17 DIAGNOSIS — F419 Anxiety disorder, unspecified: Secondary | ICD-10-CM | POA: Diagnosis not present

## 2020-06-17 DIAGNOSIS — R0602 Shortness of breath: Secondary | ICD-10-CM

## 2020-06-17 DIAGNOSIS — E7849 Other hyperlipidemia: Secondary | ICD-10-CM

## 2020-06-17 DIAGNOSIS — K588 Other irritable bowel syndrome: Secondary | ICD-10-CM

## 2020-06-17 DIAGNOSIS — E559 Vitamin D deficiency, unspecified: Secondary | ICD-10-CM | POA: Diagnosis not present

## 2020-06-17 DIAGNOSIS — F32A Depression, unspecified: Secondary | ICD-10-CM

## 2020-06-17 DIAGNOSIS — Z6835 Body mass index (BMI) 35.0-35.9, adult: Secondary | ICD-10-CM

## 2020-06-17 DIAGNOSIS — Z9189 Other specified personal risk factors, not elsewhere classified: Secondary | ICD-10-CM | POA: Diagnosis not present

## 2020-06-17 DIAGNOSIS — I2510 Atherosclerotic heart disease of native coronary artery without angina pectoris: Secondary | ICD-10-CM | POA: Diagnosis not present

## 2020-06-20 NOTE — Progress Notes (Signed)
Dear Iran Planas, PA-C,   Thank you for referring Nina Taylor to our clinic. The following note includes my evaluation and treatment recommendations.  Chief Complaint:   OBESITY Nina Taylor (MR# [id number]) is a 64 y.o. female who presents for evaluation and treatment of obesity and related comorbidities. Current BMI is Body mass index is 35.19 kg/m. Nina Taylor has been struggling with her weight for many years and has been unsuccessful in either losing weight, maintaining weight loss, or reaching her healthy weight goal.  Nina Taylor is currently in the action stage of change and ready to dedicate time achieving and maintaining a healthier weight. Nina Taylor is interested in becoming our patient and working on intensive lifestyle modifications including (but not limited to) diet and exercise for weight loss.  Nina Taylor says she heard about the clinic from coworkers.  She wakes up and has water with lemon, coconut oil, and vinegar with vitamins.  For breakfast, she has a bagel with cream cheese (gluten free).  Lunch will be 1 chicken breast and 2 cups of rice (satisfied).  Dinner will consist of 6 ounces of steak, 1 cup of rice.  After dinner, she will have chips.  Nina Taylor's habits were reviewed today and are as follows: her desired weight loss is 25 lbs, she has been heavy most of her life, she started gaining weight while traveling with work, her heaviest weight ever was 256 pounds, she craves mostly chips, she wakes up frequently in the middle of the night to eat, she frequently eats larger portions than normal and she struggles with emotional eating.  Depression Screen Nina Taylor's Food and Mood (modified PHQ-9) score was 13.  Depression screen PHQ 2/9 06/17/2020  Decreased Interest 2  Down, Depressed, Hopeless 3  PHQ - 2 Score 5  Altered sleeping 1  Tired, decreased energy 1  Change in appetite 2  Feeling bad or failure about yourself  2  Trouble concentrating 2  Moving slowly or  fidgety/restless 0  Suicidal thoughts 0  PHQ-9 Score 13  Difficult doing work/chores Not difficult at all   Subjective:   1. Other fatigue Nina Taylor denies daytime somnolence and denies waking up still tired. Patent has a history of symptoms of snoring. Liyla generally gets 7 or 8 hours of sleep per night, and states that she has generally restful sleep. Snoring is present. Apneic episodes are not present. Epworth Sleepiness Score is 4.  2. SOB (shortness of breath) on exertion S[place]ora notes increasing shortness of breath with exercising and seems to be worsening over time with weight gain. She notes getting out of breath sooner with activity than she used to. This has gotten worse recently. Nina Taylor denies shortness of breath at rest or orthopnea.  3. Other irritable bowel syndrome She is taking Xifaxan and Dexilant.  She sees [facility]. Evette Cristal at [facility].  4. Coronary artery disease involving native coronary artery of native heart without angina pectoris History of MI in 2011 with stent.  Last Cardiology appointment in 2015.  She denies seeing Cardiology currently.  5. Other hyperlipidemia Nina Taylor has hyperlipidemia and has been trying to improve her cholesterol levels with intensive lifestyle modification including a low saturated fat diet, exercise and weight loss. She denies any chest pain, claudication or myalgias.  Last LDL of 125, HDL 47, triglycerides 168 in 04/30/2020.  She is not on a statin.  History of CAD.  Lab Results  Component Value Date   ALT 29 05/28/2020   AST 17 05/28/2020  ALKPHOS 101 05/28/2020   BILITOT 0.3 05/28/2020   Lab Results  Component Value Date   CHOL 200 (H) 04/30/2020   HDL 47 (L) 04/30/2020   LDLCALC 125 (H) 04/30/2020   TRIG 168 (H) 04/30/2020   CHOLHDL 4.3 04/30/2020   6. Vitamin D deficiency Nina Taylor's Vitamin D level was 22.0 on 03/15/2018. She is currently taking OTC vitamin D 25 mcg each day. She denies nausea, vomiting or muscle  weakness.  7. Anxiety and depression, with emotional eating Nina Taylor is taking Viibryd.  She says that her symptoms are well managed.  Nina Taylor is struggling with emotional eating and using food for comfort to the extent that it is negatively impacting her health. She has been working on behavior modification techniques to help reduce her emotional eating and has been unsuccessful. She shows no sign of suicidal or homicidal ideations.  8. At risk for osteoporosis Nina Taylor is at higher risk of osteopenia and osteoporosis due to Vitamin D deficiency.   Assessment/Plan:   1. Other fatigue [place] does feel that her weight is causing her energy to be lower than it should be. Fatigue may be related to obesity, depression or many other causes. Labs will be ordered, and in the meanwhile, Myriam will focus on self care including making healthy food choices, increasing physical activity and focusing on stress reduction.  - EKG 12-Lead - Vitamin B12 - Folate - Hemoglobin A1c - Insulin, random - T3 - T4 - TSH  2. SOB (shortness of breath) on exertion Nina Taylor does feel that she gets out of breath more easily that she used to when she exercises. Nina Taylor's shortness of breath appears to be obesity related and exercise induced. She has agreed to work on weight loss and gradually increase exercise to treat her exercise induced shortness of breath. Will continue to monitor closely.  3. Other irritable bowel syndrome Continue current medications.  Will follow.  4. Coronary artery disease involving native coronary artery of native heart without angina pectoris Nina Taylor is encouraged to follow-up with Cardiology.  Referral has been placed to Cardiology.  - Ambulatory referral to Cardiology  5. Other hyperlipidemia Cardiovascular risk and specific lipid/LDL goals reviewed.  We discussed several lifestyle modifications today and Jamesyn will continue to work on diet, exercise and weight loss efforts. Orders  and follow up as documented in patient record.  Will check labs in early 2022.  Counseling Intensive lifestyle modifications are the first line treatment for this issue. . Dietary changes: Increase soluble fiber. Decrease simple carbohydrates. . Exercise changes: Moderate to vigorous-intensity aerobic activity 150 minutes per week if tolerated. . Lipid-lowering medications: see documented in medical record.  6. Vitamin D deficiency Low Vitamin D level contributes to fatigue and are associated with obesity, breast, and colon cancer.  Will check vitamin D level today.  - VITAMIN D 25 Hydroxy (Vit-D Deficiency, Fractures)  7. Anxiety and depression, with emotional eating Follow-up at next appointment.  Management per PCP.  Behavior modification techniques were discussed today to help Nina Taylor deal with her emotional/non-hunger eating behaviors.  Orders and follow up as documented in patient record.   8. At risk for osteoporosis Nina Taylor was given approximately 15 minutes of osteoporosis prevention counseling today. Nina Taylor is at risk for osteopenia and osteoporosis due to her Vitamin D deficiency. She was encouraged to take her Vitamin D and follow her higher calcium diet and increase strengthening exercise to help strengthen her bones and decrease her risk of osteopenia and osteoporosis.  Repetitive spaced learning  was employed today to elicit superior memory formation and behavioral change.  9. Class 2 severe obesity with serious comorbidity and body mass index (BMI) of 35.0 to 35.9 in adult, unspecified obesity type (HCC)  Nina Taylor is currently in the action stage of change and her goal is to continue with weight loss efforts. I recommend Nina Taylor begin the structured treatment plan as follows:  She has agreed to the Category 1 Plan.  Exercise goals: No exercise has been prescribed at this time.   Behavioral modification strategies: increasing lean protein intake, meal planning and cooking  strategies, keeping healthy foods in the home and planning for success.  She was informed of the importance of frequent follow-up visits to maximize her success with intensive lifestyle modifications for her multiple health conditions. She was informed we would discuss her lab results at her next visit unless there is a critical issue that needs to be addressed sooner. Nina Taylor agreed to keep her next visit at the agreed upon time to discuss these results.  Objective:   Blood pressure 114/76, pulse 83, temperature 98 F (36.7 C), temperature source Oral, height 5\' 6"  (1.676 m), weight 218 lb (98.9 kg), SpO2 100 %. Body mass index is 35.19 kg/m.  EKG: Normal sinus rhythm, rate 76 bpm.  Indirect Calorimeter completed today shows a VO2 of 184 and a REE of 1279.  Her calculated basal metabolic rate is 1610 thus her basal metabolic rate is worse than expected.  General: Cooperative, alert, well developed, in no acute distress. HEENT: Conjunctivae and lids unremarkable. Cardiovascular: Regular rhythm.  Lungs: Normal work of breathing. Neurologic: No focal deficits.   Lab Results  Component Value Date   CREATININE 0.60 05/28/2020   BUN 14 05/28/2020   NA 135 05/28/2020   K 4.1 05/28/2020   CL 107 05/28/2020   CO2 21 05/28/2020   Lab Results  Component Value Date   ALT 29 05/28/2020   AST 17 05/28/2020   ALKPHOS 101 05/28/2020   BILITOT 0.3 05/28/2020   Lab Results  Component Value Date   HGBA1C 6.2 (H) 06/17/2020   HGBA1C 6.2 (H) 07/08/2018   HGBA1C 6.3 (H) 03/15/2018   Lab Results  Component Value Date   INSULIN WILL FOLLOW 06/17/2020   Lab Results  Component Value Date   TSH 2.410 06/17/2020   Lab Results  Component Value Date   CHOL 200 (H) 04/30/2020   HDL 47 (L) 04/30/2020   LDLCALC 125 (H) 04/30/2020   TRIG 168 (H) 04/30/2020   CHOLHDL 4.3 04/30/2020   Lab Results  Component Value Date   WBC 11.5 (H) 04/30/2020   HGB 15.0 04/30/2020   HCT 46.0 (H)  04/30/2020   MCV 90.7 04/30/2020   PLT 224 04/30/2020   Attestation Statements:   Reviewed by clinician on day of visit: allergies, medications, problem list, medical history, surgical history, family history, social history, and previous encounter notes.  I, Water quality scientist, CMA, am acting as transcriptionist for Coralie Common, MD. This is the patient's first visit at [facility]. The patient's NEW PATIENT PACKET was reviewed at length. Included in the packet: current and past health history, medications, allergies, ROS, gynecologic history (women only), surgical history, family history, social history, weight history, weight loss surgery history (for those that have had weight loss surgery), nutritional evaluation, mood and food questionnaire, PHQ9, Epworth questionnaire, sleep habits questionnaire, patient life and health improvement goals questionnaire. These will all be scanned into the patient's chart under media.  During the visit, I independently reviewed the patient's EKG, bioimpedance scale results, and indirect calorimeter results. I used this information to tailor a meal plan for the patient that will help her to lose weight and will improve her obesity-related conditions going forward. I performed a medically necessary appropriate examination and/or evaluation. I discussed the assessment and treatment plan with the patient. The patient was provided an opportunity to ask questions and all were answered. The patient agreed with the plan and demonstrated an understanding of the instructions. Labs were ordered at this visit and will be reviewed at the next visit unless more critical results need to be addressed immediately. Clinical information was updated and documented in the EMR.   Time spent on visit including pre-visit chart review and post-visit care was 45 minutes.   A separate 15 minutes was spent on risk counseling (see above).   I have reviewed the above  documentation for accuracy and completeness, and I agree with the above. - Jinny Blossom, MD

## 2020-06-22 LAB — T3: T3, Total: 112 ng/dL (ref 71–180)

## 2020-06-22 LAB — VITAMIN B12: Vitamin B-12: 704 pg/mL (ref 232–1245)

## 2020-06-22 LAB — HEMOGLOBIN A1C
Est. average glucose Bld gHb Est-mCnc: 131 mg/dL
Hgb A1c MFr Bld: 6.2 % — ABNORMAL HIGH (ref 4.8–5.6)

## 2020-06-22 LAB — INSULIN, RANDOM: INSULIN: 15.2 u[IU]/mL (ref 2.6–24.9)

## 2020-06-22 LAB — VITAMIN D 25 HYDROXY (VIT D DEFICIENCY, FRACTURES): Vit D, 25-Hydroxy: 19.9 ng/mL — ABNORMAL LOW (ref 30.0–100.0)

## 2020-06-22 LAB — TSH: TSH: 2.41 u[IU]/mL (ref 0.450–4.500)

## 2020-06-22 LAB — T4: T4, Total: 7.2 ug/dL (ref 4.5–12.0)

## 2020-06-22 LAB — FOLATE: Folate: 10.3 ng/mL (ref >3.0)

## 2020-06-25 ENCOUNTER — Encounter: Payer: Self-pay | Admitting: Emergency Medicine

## 2020-06-25 ENCOUNTER — Other Ambulatory Visit: Payer: Self-pay

## 2020-06-25 ENCOUNTER — Emergency Department (INDEPENDENT_AMBULATORY_CARE_PROVIDER_SITE_OTHER)
Admission: EM | Admit: 2020-06-25 | Discharge: 2020-06-25 | Disposition: A | Discharge status: Home / Self Care | Payer: BLUE CROSS/BLUE SHIELD | Source: Home / Self Care

## 2020-06-25 DIAGNOSIS — R42 Dizziness and giddiness: Secondary | ICD-10-CM

## 2020-06-25 DIAGNOSIS — M542 Cervicalgia: Secondary | ICD-10-CM

## 2020-06-25 LAB — POCT URINALYSIS DIP (MANUAL ENTRY)
Bilirubin, UA: NEGATIVE
Blood, UA: NEGATIVE
Glucose, UA: NEGATIVE mg/dL
Ketones, POC UA: NEGATIVE mg/dL
Leukocytes, UA: NEGATIVE
Nitrite, UA: NEGATIVE
Protein Ur, POC: NEGATIVE mg/dL
Spec Grav, UA: 1.02 (ref 1.010–1.025)
Urobilinogen, UA: 0.2 E.U./dL
pH, UA: 5 (ref 5.0–8.0)

## 2020-06-25 MED ORDER — MECLIZINE HCL 25 MG PO TABS: 25.0000 mg | ORAL_TABLET | Freq: Three times a day (TID) | ORAL | 0 refills | Status: DC | PRN

## 2020-06-25 MED ORDER — ONDANSETRON 4 MG PO TBDP
4.0000 mg | ORAL_TABLET | Freq: Once | ORAL | Status: AC
  Administered 2020-06-25: 4 mg via ORAL

## 2020-06-25 MED ORDER — ONDANSETRON 4 MG PO TBDP: 4.0000 mg | ORAL_TABLET | Freq: Three times a day (TID) | ORAL | 0 refills | Status: DC | PRN

## 2020-06-25 MED ORDER — TIZANIDINE HCL 4 MG PO TABS: 4.0000 mg | ORAL_TABLET | Freq: Every day | ORAL | 0 refills | Status: DC

## 2020-06-25 NOTE — ED Provider Notes (Signed)
[facility]    CSN: [id number] Arrival date & time: 06/25/20  1000      History   Chief Complaint Chief Complaint  Patient presents with  . Dizziness    HPI Nina Taylor is a 64 y.o. female.   HPI  Patient with history of diverticular disease, GERD, CAD presents for evaluation of dizziness.  Patient had a massage 3 days ago and later during the night developed an episode of positional related dizziness.  Patient reported every time she turns her head or neck felt the sensation that the room was spinning.  With ambulating up she felt the room spinning and that she was off balance.  She subsequently took some leftover meclizine symptoms seem to improve however upon discontinuing the meclizine symptoms recurred.  She also reports poor sleep last night as she had to lay without movement of her neck to reduce the sensation of spinning of the room. She endorses a headache however relates that to the pain she is feeling in her neck. She denies any changes in vision, any shortness of breath, any dysuria, chest pain.    Past Medical History:  Diagnosis Date  . Allergy   . Anxiety   . Back pain   . Bloating   . Constipation   . Coronary artery disease   . COVID-19 virus infection    Last week of 04/2019  . Depression   . Diverticulosis    dx in 2012  . Gas pain   . GERD (gastroesophageal reflux disease)   . Heart attack [facility]) 2011   in [place], [place] with [facility]  . History of stomach ulcers   . IBS (irritable bowel syndrome)   . Pneumonia 2011  . PONV (postoperative nausea and vomiting)   . Vitamin D deficiency     Patient Active Problem List   Diagnosis Date Noted  . Moderate episode of recurrent major depressive disorder ([place]) 12/01/2019  . Stress 10/27/2019  . No energy 03/21/2018  . Class 2 obesity due to excess calories without serious comorbidity with body mass index (BMI) of 35.0 to 35.9 in adult 03/15/2018  . Anxiety 11/23/2017  . Essential  hypertension 05/18/2017  . Elevated fasting glucose 05/18/2017  . Tobacco abuse 05/04/2017  . Left breast mass 04/19/2017  . Swelling of foot joint, left 01/10/2017  . Cerumen impaction 10/23/2014  . History of diverticulitis 05/25/2014  . Calculus of gallbladder 05/25/2014  . GERD (gastroesophageal reflux disease) 05/23/2014  . Heart attack ([place]) 05/23/2014  . CAD (coronary artery disease) 05/23/2014  . Depression 05/23/2014  . Diverticulitis 05/23/2014    Past Surgical History:  Procedure Laterality Date  . CHOLECYSTECTOMY N/A 08/15/2014   Procedure: LAPAROSCOPIC CHOLECYSTECTOMY ;  Surgeon: Coralie Keens, MD;  Location: [facility];  Service: General;  Laterality: N/A;  Laparoscopic cholecystectomy   . CHOLECYSTECTOMY, LAPAROSCOPIC  08/15/2014  . CORONARY ANGIOPLASTY WITH STENT PLACEMENT  2011   2 stents   . ESOPHAGOGASTRODUODENOSCOPY  05/27/2016  . TUBAL LIGATION  1984    OB History    Gravida  2   Para      Term      Preterm      AB      Living  2     SAB      TAB      Ectopic      Multiple      Live Births  Home Medications    Prior to Admission medications   Medication Sig Start Date End Date Taking? Authorizing Provider  AMBULATORY NON FORMULARY MEDICATION 6 tablets daily. Medication Name: [employer] Supplements   Yes [provider]  Dexlansoprazole (DEXILANT) 30 MG capsule Take 30 mg by mouth daily.   Yes [provider]  rifaximin (XIFAXAN) 550 MG TABS tablet Take 550 mg by mouth.   Yes [provider]  ALTRENO 0.05 % LOTN Apply topically at bedtime. 05/02/20   [provider]  fluticasone (FLONASE) 50 MCG/ACT nasal spray Place 2 sprays into both nostrils daily. Patient taking differently: Place 2 sprays into both nostrils daily as needed.  10/18/18   Breeback, Jade L, PA-C  Nutritional Supplements (NUTRITIONAL SUPPLEMENT PO) Take by mouth. Rejuvinex supplement    [provider]  Simethicone  (GAS-X PO) Take by mouth.    [provider]  Vilazodone HCl (VIIBRYD) 40 MG TABS Take 1 tablet (40 mg total) by mouth daily. 06/12/20   Donella Stade, PA-C    Family History Family History  Problem Relation Age of Onset  . Cancer Other   . Cancer Mother        believes it was lung cancer that could have spreaded to her esophagus since she had trouble with her esophagus  . Esophageal cancer Mother   . Cancer Father   . Colon cancer Neg Hx   . Stomach cancer Neg Hx   . Rectal cancer Neg Hx     Social History Social History   Tobacco Use  . Smoking status: Current Every Day Smoker    Packs/day: 0.25    Years: 20.00    Pack years: 5.00    Types: Cigarettes    Start date: 09/10/1993  . Smokeless tobacco: Never Used  Vaping Use  . Vaping Use: Never used  Substance Use Topics  . Alcohol use: Yes    Alcohol/week: 0.0 standard drinks    Comment: wine rarely  . Drug use: No     Allergies   Chantix [varenicline] and Vicodin [hydrocodone-acetaminophen]   Review of Systems Review of Systems Pertinent negatives listed in HPI Physical Exam Triage Vital Signs ED Triage Vitals  Enc Vitals Group     BP 06/25/20 1012 115/81     Pulse Rate 06/25/20 1012 86     Resp 06/25/20 1012 18     Temp 06/25/20 1012 98.4 F (36.9 C)     Temp Source 06/25/20 1012 Oral     SpO2 06/25/20 1012 98 %     Weight --      Height --      Head Circumference --      Peak Flow --      Pain Score 06/25/20 1017 0     Pain Loc --      Pain Edu? --      Excl. in Petroleum? --    No data found.  Updated Vital Signs BP 115/81 (BP Location: Right Arm)   Pulse 86   Temp 98.4 F (36.9 C) (Oral)   Resp 18   SpO2 98%   Visual Acuity Right Eye Distance:   Left Eye Distance:   Bilateral Distance:    Right Eye Near:   Left Eye Near:    Bilateral Near:     Physical Exam Constitutional:      Appearance: She is obese.  HENT:     Head: Normocephalic.     Right Ear: Tympanic membrane and  ear canal normal. There is no impacted cerumen.     Left Ear: Tympanic membrane and ear canal normal. There is no impacted cerumen.  Eyes:     Extraocular Movements: Extraocular movements intact.     Conjunctiva/sclera: Conjunctivae normal.     Pupils: Pupils are equal, round, and reactive to light.  Cardiovascular:     Rate and Rhythm: Normal rate and regular rhythm.  Pulmonary:     Effort: Pulmonary effort is normal.     Breath sounds: Normal breath sounds.  Skin:    General: Skin is warm and dry.  Neurological:     General: No focal deficit present.     Mental Status: She is alert.     Motor: No weakness.     Coordination: Coordination normal.  Psychiatric:        Mood and Affect: Mood normal.        Behavior: Behavior normal.        Thought Content: Thought content normal.        Judgment: Judgment normal.      UC Treatments / Results  Labs (all labs ordered are listed, but only abnormal results are displayed) Labs Reviewed  POCT URINALYSIS DIP (MANUAL ENTRY)    EKG   Radiology No results found.  Procedures Procedures (including critical care time)  Medications Ordered in UC Medications  ondansetron (ZOFRAN-ODT) disintegrating tablet 4 mg (4 mg Oral Given 06/25/20 1032)    Initial Impression / Assessment and Plan / UC Course  I have reviewed the triage vital signs and the nursing notes.  Pertinent labs & imaging results that were available during my care of the patient were reviewed by me and considered in my medical decision making (see chart for details).    Treating for recurrent vertigo and acute neck achiness.   Refilled meclizine, added Zofran, prescribed tizanidine as needed for neck pain. Recommended if symptoms resolve however recur patient may benefit from vestibular rehab advised to follow-up and discuss with primary care provider. Patient verbalized understanding and agreed with plan. Final Clinical Impressions(s) / UC Diagnoses   Final  diagnoses:  Vertigo  Neck ache     Discharge Instructions     Follow-up with primary care provider if symptoms resolve then recur would recommend referral to physical therapy for vestibular rehab.    ED Prescriptions    Medication Sig Dispense Auth. Provider   ondansetron (ZOFRAN ODT) 4 MG disintegrating tablet Take 1 tablet (4 mg total) by mouth every 8 (eight) hours as needed for nausea or vomiting. 20 tablet Scot Jun, FNP   meclizine (ANTIVERT) 25 MG tablet Take 1 tablet (25 mg total) by mouth 3 (three) times daily as needed for dizziness. 30 tablet Scot Jun, FNP   tiZANidine (ZANAFLEX) 4 MG tablet Take 1 tablet (4 mg total) by mouth at bedtime. 20 tablet Scot Jun, FNP     PDMP not reviewed this encounter.   Scot Jun, FNP 06/25/20 1103

## 2020-06-25 NOTE — Discharge Instructions (Signed)
Follow-up with primary care provider if symptoms resolve then recur would recommend referral to physical therapy for vestibular rehab.

## 2020-06-25 NOTE — ED Triage Notes (Signed)
Pt had a detox massage on sat - vertigo started on sat night Pt had same symptoms in Jan 2021 Used meclizine which helped  Currently taking Xifaxan  Nausea this am - dizziness has improved  Pt drove self [employer] vaccine

## 2020-06-27 DIAGNOSIS — F4321 Adjustment disorder with depressed mood: Secondary | ICD-10-CM | POA: Diagnosis not present

## 2020-07-01 ENCOUNTER — Encounter (INDEPENDENT_AMBULATORY_CARE_PROVIDER_SITE_OTHER): Payer: Self-pay | Admitting: Family Medicine

## 2020-07-01 ENCOUNTER — Other Ambulatory Visit: Payer: Self-pay

## 2020-07-01 ENCOUNTER — Ambulatory Visit (INDEPENDENT_AMBULATORY_CARE_PROVIDER_SITE_OTHER): Payer: BLUE CROSS/BLUE SHIELD | Admitting: Family Medicine

## 2020-07-01 VITALS — BP 107/68 | HR 73 | Temp 97.7°F | Ht 66.0 in | Wt 215.0 lb

## 2020-07-01 DIAGNOSIS — E669 Obesity, unspecified: Secondary | ICD-10-CM

## 2020-07-01 DIAGNOSIS — E559 Vitamin D deficiency, unspecified: Secondary | ICD-10-CM

## 2020-07-01 DIAGNOSIS — Z9189 Other specified personal risk factors, not elsewhere classified: Secondary | ICD-10-CM

## 2020-07-01 DIAGNOSIS — R7303 Prediabetes: Secondary | ICD-10-CM | POA: Diagnosis not present

## 2020-07-01 DIAGNOSIS — Z6834 Body mass index (BMI) 34.0-34.9, adult: Secondary | ICD-10-CM

## 2020-07-01 MED ORDER — VITAMIN D (ERGOCALCIFEROL) 1.25 MG (50000 UNIT) PO CAPS: 50000.0000 [IU] | ORAL_CAPSULE | ORAL | 0 refills | Status: DC

## 2020-07-02 ENCOUNTER — Encounter: Payer: Self-pay | Admitting: Physician Assistant

## 2020-07-02 MED ORDER — DEXILANT 30 MG PO CPDR: 30.0000 mg | DELAYED_RELEASE_CAPSULE | Freq: Every day | ORAL | 3 refills | Status: DC

## 2020-07-03 NOTE — Progress Notes (Signed)
Chief Complaint:   OBESITY Nina Taylor is here to discuss her progress with her obesity treatment plan along with follow-up of her obesity related diagnoses. Nina Taylor is on the Category 1 Plan and states she is following her eating plan approximately 100% of the time. Nina Taylor states she is doing yoga 60 minutes 3 times per week.  Today's visit was #: 2 Starting weight: 218 lbs Starting date: 06/17/2020 Today's weight: 215 lbs Today's date: 07/01/2020 Total lbs lost to date: 3 Total lbs lost since last in-office visit: 3  Interim History: Nina Taylor has been doing very well over the first 2 weeks. Stomach pain has improved dramatically. She sometimes eats less than allotted. She is going on vacation for a week with her daughter to [place] then driving down to [place]  Subjective:   1. Vitamin D deficiency Nina Taylor is not on Vit D, and last Vit D level was 19.9. She notes fatigue. I discussed labs with the patient today.  2. Pre-diabetes Nina Taylor's last A1c 6.2 and insulin 15.2. She is using monk fruit as a sweetener. Her A1c has been elevated for 3 years.  3. At risk for diabetes mellitus Nina Taylor is at higher than average risk for developing diabetes due to her obesity.   Assessment/Plan:   1. Vitamin D deficiency Low Vitamin D level contributes to fatigue and are associated with obesity, breast, and colon cancer. [place] agreed to start prescription Vitamin D 50,000 IU every week with no refills. She will follow-up for routine testing of Vitamin D, at least 2-3 times per year to avoid over-replacement.  - Vitamin D, Ergocalciferol, (DRISDOL) 1.25 MG (50000 UNIT) CAPS capsule; Take 1 capsule (50,000 Units total) by mouth every 7 (seven) days.  Dispense: 4 capsule; Refill: 0  2. Pre-diabetes Nina Taylor will continue to work on weight loss, exercise, and decreasing simple carbohydrates to help decrease the risk of diabetes. We will revisit medications options if weight loss stalls and  if her hunger increases.  3. At risk for diabetes mellitus Nina Taylor was given approximately 30 minutes of diabetes education and counseling today. We discussed intensive lifestyle modifications today with an emphasis on weight loss as well as increasing exercise and decreasing simple carbohydrates in her diet. We also reviewed medication options with an emphasis on risk versus benefit of those discussed.   Repetitive spaced learning was employed today to elicit superior memory formation and behavioral change.  4. Class 1 obesity with serious comorbidity and body mass index (BMI) of 34.0 to 34.9 in adult, unspecified obesity type Nina Taylor is currently in the action stage of change. As such, her goal is to continue with weight loss efforts. She has agreed to the Category 2 Plan with 6 oz of protein at dinner.   Exercise goals: No exercise has been prescribed at this time.  Behavioral modification strategies: increasing lean protein intake, meal planning and cooking strategies, keeping healthy foods in the home, travel eating strategies and planning for success.  Nina Taylor has agreed to follow-up with our clinic in 2 weeks. She was informed of the importance of frequent follow-up visits to maximize her success with intensive lifestyle modifications for her multiple health conditions.   Objective:   Blood pressure 107/68, pulse 73, temperature 97.7 F (36.5 C), height 5\' 6"  (1.676 m), weight 215 lb (97.5 kg), SpO2 98 %. Body mass index is 34.7 kg/m.  General: Cooperative, alert, well developed, in no acute distress. HEENT: Conjunctivae and lids unremarkable. Cardiovascular: Regular rhythm.  Lungs: Normal  work of breathing. Neurologic: No focal deficits.   Lab Results  Component Value Date   CREATININE 0.60 05/28/2020   BUN 14 05/28/2020   NA 135 05/28/2020   K 4.1 05/28/2020   CL 107 05/28/2020   CO2 21 05/28/2020   Lab Results  Component Value Date   ALT 29 05/28/2020   AST 17  05/28/2020   ALKPHOS 101 05/28/2020   BILITOT 0.3 05/28/2020   Lab Results  Component Value Date   HGBA1C 6.2 (H) 06/17/2020   HGBA1C 6.2 (H) 07/08/2018   HGBA1C 6.3 (H) 03/15/2018   Lab Results  Component Value Date   INSULIN 15.2 06/17/2020   Lab Results  Component Value Date   TSH 2.410 06/17/2020   Lab Results  Component Value Date   CHOL 200 (H) 04/30/2020   HDL 47 (L) 04/30/2020   LDLCALC 125 (H) 04/30/2020   TRIG 168 (H) 04/30/2020   CHOLHDL 4.3 04/30/2020   Lab Results  Component Value Date   WBC 11.5 (H) 04/30/2020   HGB 15.0 04/30/2020   HCT 46.0 (H) 04/30/2020   MCV 90.7 04/30/2020   PLT 224 04/30/2020   No results found for: IRON, TIBC, FERRITIN  Attestation Statements:   Reviewed by clinician on day of visit: allergies, medications, problem list, medical history, surgical history, family history, social history, and previous encounter notes.   I, Trixie Dredge, am acting as transcriptionist for Coralie Common, MD.  I have reviewed the above documentation for accuracy and completeness, and I agree with the above. - Jinny Blossom, MD

## 2020-07-08 ENCOUNTER — Other Ambulatory Visit: Payer: Self-pay

## 2020-07-08 ENCOUNTER — Encounter: Payer: Self-pay | Admitting: Physician Assistant

## 2020-07-08 ENCOUNTER — Ambulatory Visit (INDEPENDENT_AMBULATORY_CARE_PROVIDER_SITE_OTHER): Payer: BLUE CROSS/BLUE SHIELD | Admitting: Physician Assistant

## 2020-07-08 VITALS — BP 129/70 | HR 92 | Temp 98.6°F | Wt 220.0 lb

## 2020-07-08 DIAGNOSIS — I10 Essential (primary) hypertension: Secondary | ICD-10-CM

## 2020-07-08 DIAGNOSIS — F419 Anxiety disorder, unspecified: Secondary | ICD-10-CM

## 2020-07-08 DIAGNOSIS — R42 Dizziness and giddiness: Secondary | ICD-10-CM

## 2020-07-08 DIAGNOSIS — E559 Vitamin D deficiency, unspecified: Secondary | ICD-10-CM

## 2020-07-08 DIAGNOSIS — I951 Orthostatic hypotension: Secondary | ICD-10-CM

## 2020-07-08 DIAGNOSIS — R7303 Prediabetes: Secondary | ICD-10-CM | POA: Diagnosis not present

## 2020-07-08 DIAGNOSIS — E66812 Obesity, class 2: Secondary | ICD-10-CM

## 2020-07-08 DIAGNOSIS — Z6835 Body mass index (BMI) 35.0-35.9, adult: Secondary | ICD-10-CM

## 2020-07-08 MED ORDER — FLUTICASONE PROPIONATE 50 MCG/ACT NA SUSP: 2.0000 | Freq: Every day | NASAL | 2 refills | Status: DC

## 2020-07-08 MED ORDER — LORAZEPAM 0.5 MG PO TABS: 0.5000 mg | ORAL_TABLET | Freq: Three times a day (TID) | ORAL | 1 refills | Status: DC | PRN

## 2020-07-08 NOTE — Patient Instructions (Addendum)
Vertigo Vertigo is the feeling that you or your surroundings are moving when they are not. This feeling can come and go at any time. Vertigo often goes away on its own. Vertigo can be dangerous if it occurs while you are doing something that could endanger you or others, such as driving or operating machinery. Your health care provider will do tests to determine the cause of your vertigo. Tests will also help your health care provider decide how best to treat your condition. Follow these instructions at home: Eating and drinking      Drink enough fluid to keep your urine pale yellow.  Do not drink alcohol. Activity  Return to your normal activities as told by your health care provider. Ask your health care provider what activities are safe for you.  In the morning, first sit up on the side of the bed. When you feel okay, stand slowly while you hold onto something until you know that your balance is fine.  Move slowly. Avoid sudden body or head movements or certain positions, as told by your health care provider.  If you have trouble walking or keeping your balance, try using a cane for stability. If you feel dizzy or unstable, sit down right away.  Avoid doing any tasks that would cause danger to you or others if vertigo occurs.  Avoid bending down if you feel dizzy. Place items in your home so that they are easy for you to reach without leaning over.  Do not drive or use heavy machinery if you feel dizzy. General instructions  Take over-the-counter and prescription medicines only as told by your health care provider.  Keep all follow-up visits as told by your health care provider. This is important. Contact a health care provider if:  Your medicines do not relieve your vertigo or they make it worse.  You have a fever.  Your condition gets worse or you develop new symptoms.  Your family or friends notice any behavioral changes.  Your nausea or vomiting gets worse.  You  have numbness or a prickling and tingling sensation in part of your body. Get help right away if you:  Have difficulty moving or speaking.  Are always dizzy.  Faint.  Develop severe headaches.  Have weakness in your hands, arms, or legs.  Have changes in your hearing or vision.  Develop a stiff neck.  Develop sensitivity to light. Summary  Vertigo is the feeling that you or your surroundings are moving when they are not.  Your health care provider will do tests to determine the cause of your vertigo.  Follow instructions for home care. You may be told to avoid certain tasks, positions, or movements.  Contact a health care provider if your medicines do not relieve your symptoms, or if you have a fever, nausea, vomiting, or changes in behavior.  Get help right away if you have severe headaches or difficulty speaking, or you develop hearing or vision problems. This information is not intended to replace advice given to you by your health care provider. Make sure you discuss any questions you have with your health care provider. Document Revised: 07/18/2018 Document Reviewed: 07/18/2018 Elsevier Patient Education  2020 [employer].   Diabetes Mellitus and Exercise Exercising regularly is important for your overall health, especially when you have diabetes (diabetes mellitus). Exercising is not only about losing weight. It has many other health benefits, such as increasing muscle strength and bone density and reducing body fat and stress. This leads  to improved fitness, flexibility, and endurance, all of which result in better overall health. Exercise has additional benefits for people with diabetes, including:  Reducing appetite.  Helping to lower and control blood glucose.  Lowering blood pressure.  Helping to control amounts of fatty substances (lipids) in the blood, such as cholesterol and triglycerides.  Helping the body to respond better to insulin (improving insulin  sensitivity).  Reducing how much insulin the body needs.  Decreasing the risk for heart disease by: ? Lowering cholesterol and triglyceride levels. ? Increasing the levels of good cholesterol. ? Lowering blood glucose levels. What is my activity plan? Your health care provider or certified diabetes educator can help you make a plan for the type and frequency of exercise (activity plan) that works for you. Make sure that you:  Do at least 150 minutes of moderate-intensity or vigorous-intensity exercise each week. This could be brisk walking, biking, or water aerobics. ? Do stretching and strength exercises, such as yoga or weightlifting, at least 2 times a week. ? Spread out your activity over at least 3 days of the week.  Get some form of physical activity every day. ? Do not go more than 2 days in a row without some kind of physical activity. ? Avoid being inactive for more than 30 minutes at a time. Take frequent breaks to walk or stretch.  Choose a type of exercise or activity that you enjoy, and set realistic goals.  Start slowly, and gradually increase the intensity of your exercise over time. What do I need to know about managing my diabetes?   Check your blood glucose before and after exercising. ? If your blood glucose is 240 mg/dL (13.3 mmol/L) or higher before you exercise, check your urine for ketones. If you have ketones in your urine, do not exercise until your blood glucose returns to normal. ? If your blood glucose is 100 mg/dL (5.6 mmol/L) or lower, eat a snack containing 15-20 grams of carbohydrate. Check your blood glucose 15 minutes after the snack to make sure that your level is above 100 mg/dL (5.6 mmol/L) before you start your exercise.  Know the symptoms of low blood glucose (hypoglycemia) and how to treat it. Your risk for hypoglycemia increases during and after exercise. Common symptoms of hypoglycemia can include: ? Hunger. ? Anxiety. ? Sweating and feeling  clammy. ? Confusion. ? Dizziness or feeling light-headed. ? Increased heart rate or palpitations. ? Blurry vision. ? Tingling or numbness around the mouth, lips, or tongue. ? Tremors or shakes. ? Irritability.  Keep a rapid-acting carbohydrate snack available before, during, and after exercise to help prevent or treat hypoglycemia.  Avoid injecting insulin into areas of the body that are going to be exercised. For example, avoid injecting insulin into: ? The arms, when playing tennis. ? The legs, when jogging.  Keep records of your exercise habits. Doing this can help you and your health care provider adjust your diabetes management plan as needed. Write down: ? Food that you eat before and after you exercise. ? Blood glucose levels before and after you exercise. ? The type and amount of exercise you have done. ? When your insulin is expected to peak, if you use insulin. Avoid exercising at times when your insulin is peaking.  When you start a new exercise or activity, work with your health care provider to make sure the activity is safe for you, and to adjust your insulin, medicines, or food intake as needed.  Drink  plenty of water while you exercise to prevent dehydration or heat stroke. Drink enough fluid to keep your urine clear or pale yellow. Summary  Exercising regularly is important for your overall health, especially when you have diabetes (diabetes mellitus).  Exercising has many health benefits, such as increasing muscle strength and bone density and reducing body fat and stress.  Your health care provider or certified diabetes educator can help you make a plan for the type and frequency of exercise (activity plan) that works for you.  When you start a new exercise or activity, work with your health care provider to make sure the activity is safe for you, and to adjust your insulin, medicines, or food intake as needed. This information is not intended to replace advice  given to you by your health care provider. Make sure you discuss any questions you have with your health care provider. Document Revised: 03/18/2017 Document Reviewed: 02/03/2016 Elsevier Patient Education  [place].

## 2020-07-08 NOTE — Progress Notes (Deleted)
   Turn to the right.  10/19  GI  SIBO- 14 days   Going awa for thanks w  Vitamin D low.   A!C 6.2

## 2020-07-09 DIAGNOSIS — I951 Orthostatic hypotension: Secondary | ICD-10-CM | POA: Insufficient documentation

## 2020-07-09 NOTE — Progress Notes (Addendum)
Subjective:    Patient ID: Nina Taylor, female    DOB: 1955/10/20, 64 y.o.   MRN: [id number]  HPI  Pt is a 64 yo female who presents to the clinic to follow-up from ED visit with vertigo.  She presented to the ED with persistent positional related dizziness after massage.  Now every time she turned her head she felt the sensation like the room was spinning.  She was given meclizine and Zofran as well as tizanidine for neck pain.  No imaging was done.  She wanted to come for follow-up.  She is taking the meclizine and Zofran which helps.  She feels like the room spinning is much better but it still does happen.  Working with medical weight loss. a1c was up at 6.2. she is frustrated.   Still taking ativan as needed. Request refill. Not taking daily.   .. Active Ambulatory Problems    Diagnosis Date Noted   GERD (gastroesophageal reflux disease) 05/23/2014   Heart attack ([facility]) 05/23/2014   CAD (coronary artery disease) 05/23/2014   Depression 05/23/2014   Diverticulitis 05/23/2014   History of diverticulitis 05/25/2014   Calculus of gallbladder 05/25/2014   Cerumen impaction 10/23/2014   Swelling of foot joint, left 01/10/2017   Left breast mass 04/19/2017   Tobacco abuse 05/04/2017   Essential hypertension 05/18/2017   Elevated fasting glucose 05/18/2017   Anxiety 11/23/2017   Class 2 severe obesity due to excess calories with serious comorbidity and body mass index (BMI) of 35.0 to 35.9 in adult ([facility]) 03/15/2018   No energy 03/21/2018   Stress 10/27/2019   Moderate episode of recurrent major depressive disorder ([place]) 12/01/2019   Pre-diabetes 07/08/2020   Vertigo 07/08/2020   Vitamin D deficiency 07/08/2020   Orthostatic hypotension 07/09/2020   Resolved Ambulatory Problems    Diagnosis Date Noted   Morbid obesity ([facility]) 01/10/2017   Past Medical History:  Diagnosis Date   Allergy    Back pain    Bloating    Constipation    Coronary artery  disease    COVID-19 virus infection    Diverticulosis    Gas pain    History of stomach ulcers    IBS (irritable bowel syndrome)    Pneumonia 2011   PONV (postoperative nausea and vomiting)      Review of Systems  All other systems reviewed and are negative.      Objective:   Physical Exam Vitals reviewed.  Constitutional:      Appearance: Normal appearance. She is obese.  Cardiovascular:     Rate and Rhythm: Normal rate and regular rhythm.     Pulses: Normal pulses.  Pulmonary:     Effort: Pulmonary effort is normal.     Breath sounds: Normal breath sounds.  Musculoskeletal:     Right lower leg: No edema.     Left lower leg: No edema.  Neurological:     General: No focal deficit present.     Mental Status: She is alert and oriented to person, place, and time.     Comments: dix hallpike positive to the right with nystagmus.   Psychiatric:        Mood and Affect: Mood normal.           Assessment & Plan:  Marland KitchenMarland KitchenValta was seen today for dizziness and results.  Diagnoses and all orders for this visit:  Vertigo -     fluticasone (FLONASE) 50 MCG/ACT nasal spray; Place 2 sprays into both nostrils  daily.  Pre-diabetes  Vitamin D deficiency  Anxiety -     LORazepam (ATIVAN) 0.5 MG tablet; Take 1 tablet (0.5 mg total) by mouth every 8 (eight) hours as needed. for anxiety  Essential hypertension  Class 2 severe obesity due to excess calories with serious comorbidity and body mass index (BMI) of 35.0 to 35.9 in adult ([facility])   BP looks good. She was given copy of Epley maneuvers to do 3 rounds 3 times a day for the next few days until her dizziness here spinning dizziness has resolved.  Added Flonase to help with any eustachian tube dysfunction.  Okay to use Antivert as needed and/or Zofran.  If not improving can consider vestibular rehab. If this continues to be a problem please follow-up in office.  Patient is working with weight loss office group.  Her A1c  was in prediabetes range.  They are going to recheck in 3 months with diet and exercise.  Discussed there are some medications like she could get approved if she was diabetic it could also help with weight loss.  Continue to work that plan.  Refilled Ativan for as needed usage.  Patient is aware of the dependency risk and uses sparingly.

## 2020-07-16 ENCOUNTER — Encounter: Payer: Self-pay | Admitting: Physician Assistant

## 2020-07-16 NOTE — Telephone Encounter (Signed)
Letter written to send.

## 2020-07-16 NOTE — Telephone Encounter (Signed)
Pt has tried in the past omeprazole, pantoprazole, esomeprazole, ranitidine, famotidine which have some minimal benefit but have not resolved GERD symptoms like dexilant. Letter written and placed for consideration.

## 2020-07-17 ENCOUNTER — Ambulatory Visit (INDEPENDENT_AMBULATORY_CARE_PROVIDER_SITE_OTHER): Payer: BLUE CROSS/BLUE SHIELD | Admitting: Family Medicine

## 2020-07-17 ENCOUNTER — Telehealth: Payer: Self-pay | Admitting: *Deleted

## 2020-07-17 NOTE — Telephone Encounter (Signed)
Dexilant approved finally.  Pharmacy notified.

## 2020-07-18 DIAGNOSIS — K580 Irritable bowel syndrome with diarrhea: Secondary | ICD-10-CM | POA: Diagnosis not present

## 2020-07-18 DIAGNOSIS — R1032 Left lower quadrant pain: Secondary | ICD-10-CM | POA: Diagnosis not present

## 2020-07-26 ENCOUNTER — Ambulatory Visit: Payer: BLUE CROSS/BLUE SHIELD | Admitting: Gastroenterology

## 2020-08-08 DIAGNOSIS — F4321 Adjustment disorder with depressed mood: Secondary | ICD-10-CM | POA: Diagnosis not present

## 2020-09-14 ENCOUNTER — Other Ambulatory Visit: Payer: Self-pay | Admitting: Physician Assistant

## 2020-09-14 DIAGNOSIS — F331 Major depressive disorder, recurrent, moderate: Secondary | ICD-10-CM

## 2020-09-14 DIAGNOSIS — F419 Anxiety disorder, unspecified: Secondary | ICD-10-CM

## 2020-09-17 ENCOUNTER — Other Ambulatory Visit: Payer: Self-pay | Admitting: Gastroenterology

## 2020-09-17 DIAGNOSIS — R140 Abdominal distension (gaseous): Secondary | ICD-10-CM

## 2020-09-17 DIAGNOSIS — R109 Unspecified abdominal pain: Secondary | ICD-10-CM

## 2020-09-17 DIAGNOSIS — R634 Abnormal weight loss: Secondary | ICD-10-CM

## 2020-10-07 DIAGNOSIS — F4310 Post-traumatic stress disorder, unspecified: Secondary | ICD-10-CM | POA: Diagnosis not present

## 2020-10-21 DIAGNOSIS — F4321 Adjustment disorder with depressed mood: Secondary | ICD-10-CM | POA: Diagnosis not present

## 2020-11-07 DIAGNOSIS — F4321 Adjustment disorder with depressed mood: Secondary | ICD-10-CM | POA: Diagnosis not present

## 2020-11-13 ENCOUNTER — Ambulatory Visit: Payer: BLUE CROSS/BLUE SHIELD | Admitting: Physician Assistant

## 2020-11-13 ENCOUNTER — Other Ambulatory Visit: Payer: Self-pay

## 2020-11-13 ENCOUNTER — Encounter: Payer: Self-pay | Admitting: Physician Assistant

## 2020-11-13 VITALS — Ht 66.0 in | Wt 232.0 lb

## 2020-11-13 DIAGNOSIS — Z1231 Encounter for screening mammogram for malignant neoplasm of breast: Secondary | ICD-10-CM | POA: Diagnosis not present

## 2020-11-13 DIAGNOSIS — Z131 Encounter for screening for diabetes mellitus: Secondary | ICD-10-CM

## 2020-11-13 DIAGNOSIS — H8111 Benign paroxysmal vertigo, right ear: Secondary | ICD-10-CM

## 2020-11-13 DIAGNOSIS — R635 Abnormal weight gain: Secondary | ICD-10-CM

## 2020-11-13 DIAGNOSIS — Z13220 Encounter for screening for lipoid disorders: Secondary | ICD-10-CM

## 2020-11-13 DIAGNOSIS — F419 Anxiety disorder, unspecified: Secondary | ICD-10-CM

## 2020-11-13 DIAGNOSIS — R1110 Vomiting, unspecified: Secondary | ICD-10-CM

## 2020-11-13 DIAGNOSIS — Z13820 Encounter for screening for osteoporosis: Secondary | ICD-10-CM | POA: Diagnosis not present

## 2020-11-13 DIAGNOSIS — E6609 Other obesity due to excess calories: Secondary | ICD-10-CM

## 2020-11-13 DIAGNOSIS — Z6838 Body mass index (BMI) 38.0-38.9, adult: Secondary | ICD-10-CM

## 2020-11-13 DIAGNOSIS — F32A Depression, unspecified: Secondary | ICD-10-CM

## 2020-11-13 MED ORDER — FLUTICASONE PROPIONATE 50 MCG/ACT NA SUSP: 2.0000 | Freq: Every day | NASAL | 2 refills | Status: AC

## 2020-11-13 MED ORDER — MECLIZINE HCL 25 MG PO TABS: 25.0000 mg | ORAL_TABLET | Freq: Three times a day (TID) | ORAL | 0 refills | Status: DC | PRN

## 2020-11-13 MED ORDER — BUSPIRONE HCL 7.5 MG PO TABS: 7.5000 mg | ORAL_TABLET | Freq: Three times a day (TID) | ORAL | 1 refills | Status: DC

## 2020-11-13 MED ORDER — PROMETHAZINE HCL 25 MG/ML IJ SOLN
25.0000 mg | Freq: Once | INTRAMUSCULAR | Status: AC
  Administered 2020-11-13: 25 mg via INTRAMUSCULAR

## 2020-11-13 NOTE — Progress Notes (Signed)
Viibryd not working, causing weight gain Orthostatics okay Dizziness started Saturday Meclizine 25 mg TID helping

## 2020-11-13 NOTE — Progress Notes (Signed)
Subjective:    Patient ID: Nina Taylor, female    DOB: 1955-09-15, 65 y.o.   MRN: [id number]  HPI  Patient is a 65 year old obese female with hypertension, CAD, GERD, anxiety, depression, history of vertigo who presents to the clinic with multiple concerns today.  Her main concern is her vertigo that restarted on Sunday.  She woke up and the room was spinning.  She has had this once before in October of last year.  She was able to silence the vertigo with Antivert and Flonase.  She has not had any episodes since she increased her yoga to 4 times a week.  She has a lot of nausea and vomiting associated with the dizziness that is worse when she turns to the right.  She does admit she has been out of her Flonase.  Patient is having a lot of anxiety and depressed mood due to her work situation.  She feels like she is being treated unfairly and not respected.  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  She feels like the Viibryd is just not working anymore.  She is gaining weight.  She has not done so well and lost weight now gaining it.  She wonders if her antidepressant could be causing that.  She is going to therapy which is helping some.   Patient wants to do something about the vertigo, anxiety, weight.   .. Active Ambulatory Problems    Diagnosis Date Noted  . GERD (gastroesophageal reflux disease) 05/23/2014  . Heart attack ([place]) 05/23/2014  . CAD (coronary artery disease) 05/23/2014  . Depression 05/23/2014  . Diverticulitis 05/23/2014  . History of diverticulitis 05/25/2014  . Calculus of gallbladder 05/25/2014  . Cerumen impaction 10/23/2014  . Swelling of foot joint, left 01/10/2017  . Left breast mass 04/19/2017  . Tobacco abuse 05/04/2017  . Essential hypertension 05/18/2017  . Elevated fasting glucose 05/18/2017  . Anxiety 11/23/2017  . Class 2 obesity due to excess calories without serious comorbidity with body  mass index (BMI) of 38.0 to 38.9 in adult 03/15/2018  . No energy 03/21/2018  . Stress 10/27/2019  . Moderate episode of recurrent major depressive disorder ([place]) 12/01/2019  . Pre-diabetes 07/08/2020  . Vertigo 07/08/2020  . Vitamin D deficiency 07/08/2020  . Orthostatic hypotension 07/09/2020  . BPPV (benign paroxysmal positional vertigo), right 11/13/2020   Resolved Ambulatory Problems    Diagnosis Date Noted  . Morbid obesity ([place]) 01/10/2017   Past Medical History:  Diagnosis Date  . Allergy   . Back pain   . Bloating   . Constipation   . Coronary artery disease   . COVID-19 virus infection   . Diverticulosis   . Gas pain   . History of stomach ulcers   . IBS (irritable bowel syndrome)   . Pneumonia 2011  . PONV (postoperative nausea and vomiting)      Review of Systems See HPI.     Objective:   Physical Exam Vitals reviewed.  Constitutional:      Appearance: Normal appearance. She is obese.  HENT:     Right Ear: Tympanic membrane, ear canal and external ear normal. There is no impacted cerumen.     Left Ear: Tympanic membrane, ear canal and external ear normal. There is no impacted cerumen.     Nose: Nose normal.     Mouth/Throat:     Mouth: Mucous membranes are moist.  Pharynx: No oropharyngeal exudate.  Eyes:     Conjunctiva/sclera: Conjunctivae normal.  Cardiovascular:     Rate and Rhythm: Normal rate and regular rhythm.     Pulses: Normal pulses.  Pulmonary:     Effort: Pulmonary effort is normal.  Neurological:     General: No focal deficit present.     Mental Status: She is alert and oriented to person, place, and time.     Comments: Positive dix hallpike to the right with nystagmus. Nystagmus brought on nausea and vomiting.  Not able to walk out of room without wheelchair. PT was called over and tried to perform maneuver again with more vomiting.   Psychiatric:     Comments: anxious      .Marland Kitchen Depression screen [facility] 2/9 11/13/2020 06/17/2020  12/01/2019 03/22/2019 12/20/2018  Decreased Interest 3 2 2 1 2   Down, Depressed, Hopeless 3 3 2  0 2  PHQ - 2 Score 6 5 4 1 4   Altered sleeping 3 1 2  0 2  Tired, decreased energy 2 1 2  0 0  Change in appetite 1 2 2 3  0  Feeling bad or failure about yourself  3 2 2  0 0  Trouble concentrating 3 2 3 3 3   Moving slowly or fidgety/restless 2 0 3 3 3   Suicidal thoughts 0 0 0 0 0  PHQ-9 Score 20 13 18 10 12   Difficult doing work/chores Extremely dIfficult Not difficult at all Very difficult Very difficult Extremely dIfficult   .Marland Kitchen GAD 7 : Generalized Anxiety Score 11/13/2020 12/01/2019 03/22/2019 12/20/2018  Nervous, Anxious, on Edge 3 1 3 3   Control/stop worrying 3 2 3 3   Worry too much - different things 3 2 3 3   Trouble relaxing 3 3 3 3   Restless 2 3 3 3   Easily annoyed or irritable 2 3 2 3   Afraid - awful might happen 2 1 2 3   Total GAD 7 Score 18 15 19 21   Anxiety Difficulty Extremely difficult Very difficult Somewhat difficult Very difficult         Assessment & Plan:  Marland KitchenMarland KitchenAraya was seen today for dizziness.  Diagnoses and all orders for this visit:  BPPV (benign paroxysmal positional vertigo), right -     meclizine (ANTIVERT) 25 MG tablet; Take 1 tablet (25 mg total) by mouth 3 (three) times daily as needed for dizziness. -     Ambulatory referral to Physical Therapy  Encounter for screening mammogram for malignant neoplasm of breast -     MM 3D SCREEN BREAST BILATERAL  Osteoporosis screening -     DG Bone Density  Abnormal weight gain -     TSH -     CBC with Differential/Platelet  Screening for diabetes mellitus -     COMPLETE METABOLIC PANEL WITH GFR  Screening for lipid disorders -     Lipid Panel w/reflex Direct LDL  Vomiting, intractability of vomiting not specified, presence of nausea not specified, unspecified vomiting type -     meclizine (ANTIVERT) 25 MG tablet; Take 1 tablet (25 mg total) by mouth 3 (three) times daily as needed for dizziness. -      promethazine (PHENERGAN) injection 25 mg  Anxiety and depression -     busPIRone (BUSPAR) 7.5 MG tablet; Take 1 tablet (7.5 mg total) by mouth 3 (three) times daily.  Class 2 obesity due to excess calories without serious comorbidity with body mass index (BMI) of 38.0 to 38.9 in adult -  fluticasone (FLONASE) 50 MCG/ACT nasal spray; Place 2 sprays into both nostrils daily.   PHQ/GAD numbers are way up. Stay on viibryd. Continue counseling. Added buspar.   I do not think viibryd is making her gain weight. I feel like stress is. Labs ordered.   Attemped epley maneuvers in office 3 times all brought on nystagmus and vomiting. Phenergan 25mg  IM given in office and tried again.  Scheduled for PT on Friday at 1:15. Take meclizine before. Start back on flonase. HO given to patient.   Labs and screening test ordered.   Spent 40 minutes with patient reviewing chart, discussing medications and side effects, counseling and performing epley manuver for BPPV. I was assisted by Rudell Cobb PT to help due to patients severe response to maneuver.   Follow up in 1 month.

## 2020-11-13 NOTE — Patient Instructions (Addendum)
buspar twice a day for anxiety.  Appt with Vestibular Rehab scheduled for Friday at 1:15 pm Take Meclizine three times daily until that time

## 2020-11-15 ENCOUNTER — Ambulatory Visit (INDEPENDENT_AMBULATORY_CARE_PROVIDER_SITE_OTHER): Payer: BLUE CROSS/BLUE SHIELD | Admitting: Rehabilitative and Restorative Service Providers"

## 2020-11-15 ENCOUNTER — Encounter: Payer: Self-pay | Admitting: Physician Assistant

## 2020-11-15 ENCOUNTER — Other Ambulatory Visit: Payer: Self-pay

## 2020-11-15 DIAGNOSIS — R42 Dizziness and giddiness: Secondary | ICD-10-CM

## 2020-11-15 DIAGNOSIS — H8111 Benign paroxysmal vertigo, right ear: Secondary | ICD-10-CM

## 2020-11-15 NOTE — Therapy (Signed)
[place], [place], [address] Phone: [phone]   Fax:  [phone]  Physical Therapy Evaluation  Patient Details  Name: Nina Taylor MRN: [id number] Date of Birth: Jul 03, 1956 Referring Provider (PT): Iran Planas, [place]   Encounter Date: 11/15/2020   PT End of Session - 11/15/20 1402    Visit Number 1    Number of Visits 6    Date for PT Re-Evaluation 12/27/20    Authorization Type BCBS    PT Start Time 2426    PT Stop Time 1359    PT Time Calculation (min) 46 min    Activity Tolerance Other (comment)   limited by nausea today   Behavior During Therapy [facility] for tasks assessed/performed           Past Medical History:  Diagnosis Date  . Allergy   . Anxiety   . Back pain   . Bloating   . Constipation   . Coronary artery disease   . COVID-19 virus infection    Last week of 04/2019  . Depression   . Diverticulosis    dx in 2012  . Gas pain   . GERD (gastroesophageal reflux disease)   . Heart attack [facility]) 2011   in [place], [place] with [facility]  . History of stomach ulcers   . IBS (irritable bowel syndrome)   . Pneumonia 2011  . PONV (postoperative nausea and vomiting)   . Vitamin D deficiency     Past Surgical History:  Procedure Laterality Date  . CHOLECYSTECTOMY N/A 08/15/2014   Procedure: LAPAROSCOPIC CHOLECYSTECTOMY ;  Surgeon: Coralie Keens, MD;  Location: [facility];  Service: General;  Laterality: N/A;  Laparoscopic cholecystectomy   . CHOLECYSTECTOMY, LAPAROSCOPIC  08/15/2014  . CORONARY ANGIOPLASTY WITH STENT PLACEMENT  2011   2 stents   . ESOPHAGOGASTRODUODENOSCOPY  05/27/2016  . TUBAL LIGATION  1984    There were no vitals filed for this visit.    Subjective Assessment - 11/15/20 1311    Subjective The patient has a h/o vertigo last year that was constant in nature that lasted x 3 days.  She had positional vertigo in October that cleared after a couple of  days.  She increased her routine with yoga recently and woke on Sunday with spinning vertigo and n/v.  She was seen in MD office on Wednesday and had n/v with testing.    Pertinent History h/o MI 2011, stents x 2    Patient Stated Goals get rid of vertigo, return to yoga    Currently in Pain? No/denies   stiffness in the neck             [facility] PT Assessment - 11/15/20 1319      Assessment   Medical Diagnosis BPPV    Referring Provider (PT) Iran Planas, PA-C    Onset Date/Surgical Date 11/10/20    Prior Therapy has had accupuncture in the past for vertigo      Precautions   Precautions None      Restrictions   Weight Bearing Restrictions No      Balance Screen   Has the patient fallen in the past 6 months No    Has the patient had a decrease in activity level because of a fear of falling?  No    Is the patient reluctant to leave their home because of a fear of falling?  No      Home Environment   Living Environment  Private residence    Living Arrangements Other relatives   caregiver for her sister   Type of [place] None      Prior Function   Level of Independence Independent                  Vestibular Assessment - 11/15/20 1320      Vestibular Assessment   General Observation The patient walks into clinic independently without a device      Symptom Behavior   Subjective history of current problem Sudden onset of spinning on Sunday 3/6    Type of Dizziness  Spinning   nausea/vomitting   Frequency of Dizziness daily    Duration of Dizziness seconds    Symptom Nature Positional    Aggravating Factors Activity in general;Mornings;Rolling to right;Turning head quickly;Lying supine;Looking up to the ceiling   putting leashes on dogs, getting up in middle of night   Relieving Factors Head stationary    History of similar episodes October 2021, March 2021      Oculomotor Exam   Oculomotor Alignment Normal    Ocular ROM WFLs     Spontaneous Absent    Gaze-induced  Absent    Smooth Pursuits Intact    Saccades Intact      Positional Testing   Dix-Hallpike --   PT saw patient on Wed in MD office and knows it is R posterior canalithiasis.  Other canals were not assessed due to severe n/v.  PT initiated treatment.             Objective measurements completed on examination: See above findings.        Vestibular Treatment/Exercise - 11/15/20 1336      Vestibular Treatment/Exercise   Vestibular Treatment Provided Canalith Repositioning;Habituation    Canalith Repositioning Epley Manuever Right    Habituation Exercises Laruth Bouchard Daroff       EPLEY MANUEVER RIGHT   Number of Reps  2    Overall Response Improved Symptoms    Response Details  1st rep-- only mild symptoms provoked (PT used 3 pillows and moved slowly into first position, then removed a pillow).  2nd rep-more severe symptoms when we began with 2 pillows and moved a little faster into position.  Also noted more severe symptoms rolling into L sidelying position.  Nausea began, but no vomitting.  Patient di dnot think she could tolerate any further positional testing to reassess.      Nestor Lewandowsky   Number of Reps  1    Symptom Description  practiced to the left side so patient would be able to perform in home (and didn't do to right b/c of nausea).  Recommended begin on Sunday if any vertigo remains with 3 pillows and moving slowly.  Also recommended to have help at home (she has a neighbor that is available)                 PT Education - 11/15/20 1358    Education Details HEP- gentle habituation (modified to tolerance)    Person(s) Educated Patient    Methods Explanation;Demonstration;Handout    Comprehension Verbalized understanding;Returned demonstration               PT Long Term Goals - 11/15/20 1754      PT LONG TERM GOAL #1   Title The patient will be independent with HEP for habituation.    Time 6    Period Weeks  Target Date 12/27/20      PT LONG TERM GOAL #2   Title The patient will have negative R dix hallpike testing indicating resolution of BPPV.    Time 6    Period Weeks    Target Date 12/27/20      PT LONG TERM GOAL #3   Title The patient will report no sensation of spinning with bed mobility.    Time 6    Period Weeks    Target Date 12/27/20                  Plan - 11/15/20 1755    Clinical Impression Statement The patient is a 65 yo female presenting to OP physical therapy with h/o recurring BPPV with recent onset on 11/10/20.  The patient has had severe n/v with the vertigo that limited our assessment today.  PT did not clear all canals, but began with slow treatment for R BPPV.  The patient tolerated 2 reps of epley's and then we stopped due to increasing nausea.  We reviewed how to begin slow habituation with pillows to avoid severe symptoms for home treatment if any dizziness remains.    Personal Factors and Comorbidities Comorbidity 1;Comorbidity 2    Comorbidities h/o MI, h/o prior vertigo    Examination-Activity Limitations Bed Mobility    Examination-Participation Restrictions Cleaning;Occupation    Stability/Clinical Decision Making Stable/Uncomplicated    Clinical Decision Making Low    Rehab Potential Good    PT Frequency 1x / week    PT Duration 6 weeks    PT Treatment/Interventions ADLs/Self Care Home Management;Canalith Repostioning;Vestibular;Neuromuscular re-education;Manual techniques;Patient/family education    PT Next Visit Plan Reassess R BPPV, ensure other canals cleared as patient tolerates    PT Home Exercise Plan Access Code: [email or web address]    Consulted and Agree with Plan of Care Patient           Patient will benefit from skilled therapeutic intervention in order to improve the following deficits and impairments:  Dizziness,Decreased activity tolerance  Visit Diagnosis: BPPV (benign paroxysmal positional vertigo), right  Dizziness and  giddiness     Problem List Patient Active Problem List   Diagnosis Date Noted  . BPPV (benign paroxysmal positional vertigo), right 11/13/2020  . Orthostatic hypotension 07/09/2020  . Pre-diabetes 07/08/2020  . Vertigo 07/08/2020  . Vitamin D deficiency 07/08/2020  . Moderate episode of recurrent major depressive disorder ([place]) 12/01/2019  . Stress 10/27/2019  . No energy 03/21/2018  . Class 2 obesity due to excess calories without serious comorbidity with body mass index (BMI) of 38.0 to 38.9 in adult 03/15/2018  . Anxiety 11/23/2017  . Essential hypertension 05/18/2017  . Elevated fasting glucose 05/18/2017  . Tobacco abuse 05/04/2017  . Left breast mass 04/19/2017  . Swelling of foot joint, left 01/10/2017  . Cerumen impaction 10/23/2014  . History of diverticulitis 05/25/2014  . Calculus of gallbladder 05/25/2014  . GERD (gastroesophageal reflux disease) 05/23/2014  . Heart attack ([place]) 05/23/2014  . CAD (coronary artery disease) 05/23/2014  . Depression 05/23/2014  . Diverticulitis 05/23/2014    [place] , PT 11/15/2020, 6:02 PM  [facility] [place], [place], [address] Phone: [phone]   Fax:  [phone]  Name: Bernadean Saling MRN: [id number] Date of Birth: 08-Jan-1956

## 2020-11-15 NOTE — Patient Instructions (Signed)
Access Code: [facility] URL: [email or web address] Date: 11/15/2020 Prepared by: Rudell Cobb  Program Notes Begin this exercise on Sunday 3/13 if you still notice dizziness.  Have a neighbor or help available just in case.     Exercises Brandt-Daroff Vestibular Exercise - 2 x daily - 7 x weekly - 1 sets - 5 reps

## 2020-11-15 NOTE — Progress Notes (Signed)
Viv,   Thyroid normal.  Kidney, liver look find.  Glucose was a little elevated. Were you fasting?  LDL stable.  HDL stable.  TG much better.    How is dizziness?

## 2020-11-18 LAB — CBC WITH DIFFERENTIAL/PLATELET
Absolute Monocytes: 687 cells/uL (ref 200–950)
Basophils Absolute: 65 cells/uL (ref 0–200)
Basophils Relative: 0.6 %
Eosinophils Absolute: 185 cells/uL (ref 15–500)
Eosinophils Relative: 1.7 %
HCT: 43.2 % (ref 35.0–45.0)
Hemoglobin: 14.2 g/dL (ref 11.7–15.5)
Lymphs Abs: 2801 cells/uL (ref 850–3900)
MCH: 29.3 pg (ref 27.0–33.0)
MCHC: 32.9 g/dL (ref 32.0–36.0)
MCV: 89.1 fL (ref 80.0–100.0)
MPV: 10.9 fL (ref 7.5–12.5)
Monocytes Relative: 6.3 %
Neutro Abs: 7161 cells/uL (ref 1500–7800)
Neutrophils Relative %: 65.7 %
Platelets: 227 10*3/uL (ref 140–400)
RBC: 4.85 10*6/uL (ref 3.80–5.10)
RDW: 12.7 % (ref 11.0–15.0)
Total Lymphocyte: 25.7 %
WBC: 10.9 10*3/uL — ABNORMAL HIGH (ref 3.8–10.8)

## 2020-11-18 LAB — COMPLETE METABOLIC PANEL WITH GFR
AG Ratio: 1.5 (calc) (ref 1.0–2.5)
ALT: 24 U/L (ref 6–29)
AST: 16 U/L (ref 10–35)
Albumin: 4.4 g/dL (ref 3.6–5.1)
Alkaline phosphatase (APISO): 80 U/L (ref 37–153)
BUN: 18 mg/dL (ref 7–25)
CO2: 26 mmol/L (ref 20–32)
Calcium: 9.8 mg/dL (ref 8.6–10.4)
Chloride: 104 mmol/L (ref 98–110)
Creat: 0.7 mg/dL (ref 0.50–0.99)
GFR, Est African American: 105 mL/min/{1.73_m2} (ref >=60)
GFR, Est Non African American: 91 mL/min/{1.73_m2} (ref >=60)
Globulin: 3 g/dL (calc) (ref 1.9–3.7)
Glucose, Bld: 111 mg/dL — ABNORMAL HIGH (ref 65–99)
Potassium: 4.6 mmol/L (ref 3.5–5.3)
Sodium: 142 mmol/L (ref 135–146)
Total Bilirubin: 0.2 mg/dL (ref 0.2–1.2)
Total Protein: 7.4 g/dL (ref 6.1–8.1)

## 2020-11-18 LAB — LIPID PANEL W/REFLEX DIRECT LDL
Cholesterol: 193 mg/dL (ref <200)
HDL: 48 mg/dL — ABNORMAL LOW (ref >=50)
LDL Cholesterol (Calc): 120 mg/dL (calc) — ABNORMAL HIGH
Non-HDL Cholesterol (Calc): 145 mg/dL (calc) — ABNORMAL HIGH (ref <130)
Total CHOL/HDL Ratio: 4 (calc) (ref <5.0)
Triglycerides: 134 mg/dL (ref <150)

## 2020-11-18 LAB — HEMOGLOBIN A1C W/OUT EAG: Hgb A1c MFr Bld: 6.2 % of total Hgb — ABNORMAL HIGH (ref <5.7)

## 2020-11-18 LAB — TSH: TSH: 2.23 mIU/L (ref 0.40–4.50)

## 2020-11-18 NOTE — Progress Notes (Signed)
Nina Taylor,   A1C stable in pre-diabetes range.

## 2020-11-22 ENCOUNTER — Ambulatory Visit (INDEPENDENT_AMBULATORY_CARE_PROVIDER_SITE_OTHER): Payer: BLUE CROSS/BLUE SHIELD | Admitting: Rehabilitative and Restorative Service Providers"

## 2020-11-22 ENCOUNTER — Encounter: Payer: Self-pay | Admitting: Rehabilitative and Restorative Service Providers"

## 2020-11-22 ENCOUNTER — Other Ambulatory Visit: Payer: Self-pay

## 2020-11-22 DIAGNOSIS — R42 Dizziness and giddiness: Secondary | ICD-10-CM

## 2020-11-22 DIAGNOSIS — H8111 Benign paroxysmal vertigo, right ear: Secondary | ICD-10-CM

## 2020-11-22 NOTE — Therapy (Addendum)
[place], [place], [address] Phone: [phone]   Fax:  [phone]  Physical Therapy Treatment and Discharge Summary  Patient Details  Name: Nina Taylor MRN: [id number] Date of Birth: Jun 05, 1956 Referring Provider (PT): Iran Planas, PA-C   Patient did not return to PT.  Please refer to initial evaluation for patient status. Thank you for the referral of this patient. Rudell Cobb, MPT   Encounter Date: 11/22/2020   PT End of Session - 11/22/20 1605    Visit Number 2    Number of Visits 6    Date for PT Re-Evaluation 12/27/20    Authorization Type BCBS    PT Start Time 1530    PT Stop Time 1600    PT Time Calculation (min) 30 min    Activity Tolerance Other (comment)   stopped after 30 minutes due to mild nausea   Behavior During Therapy [facility] for tasks assessed/performed           Past Medical History:  Diagnosis Date  . Allergy   . Anxiety   . Back pain   . Bloating   . Constipation   . Coronary artery disease   . COVID-19 virus infection    Last week of 04/2019  . Depression   . Diverticulosis    dx in 2012  . Gas pain   . GERD (gastroesophageal reflux disease)   . Heart attack [facility]) 2011   in [place], [place] with [facility]  . History of stomach ulcers   . IBS (irritable bowel syndrome)   . Pneumonia 2011  . PONV (postoperative nausea and vomiting)   . Vitamin D deficiency     Past Surgical History:  Procedure Laterality Date  . CHOLECYSTECTOMY N/A 08/15/2014   Procedure: LAPAROSCOPIC CHOLECYSTECTOMY ;  Surgeon: Coralie Keens, MD;  Location: [facility];  Service: General;  Laterality: N/A;  Laparoscopic cholecystectomy   . CHOLECYSTECTOMY, LAPAROSCOPIC  08/15/2014  . CORONARY ANGIOPLASTY WITH STENT PLACEMENT  2011   2 stents   . ESOPHAGOGASTRODUODENOSCOPY  05/27/2016  . TUBAL LIGATION  1984    There were no vitals filed for this visit.   Subjective  Assessment - 11/22/20 1530    Subjective The patient reports overall she is feeling better.  She did spring cleaning the day after last session and felt great.  She thinks she overdid it.  She is laying on her left side at night  She gets mild dizziness for a few seconds getting out of bed, and then it settles.  She tried the exercise at home and felt spinning.  She stopped --- it wasn't as bad as when she was tested in here and at [facility] office.    Pertinent History h/o MI 2011, stents x 2    Patient Stated Goals get rid of vertigo, return to yoga    Currently in Pain? No/denies              [facility] PT Assessment - 11/22/20 1533      Assessment   Medical Diagnosis BPPV    Referring Provider (PT) Iran Planas, PA-C    Onset Date/Surgical Date 11/10/20               Vestibular Assessment - 11/22/20 1533      Positional Testing   Dix-Hallpike Dix-Hallpike Right    Sidelying Test Sidelying Right;Sidelying Left      Dix-Hallpike Right   Dix-Hallpike Right Duration Moved at slow pace  using 2 pillows to get 30 degrees of neck extension; used ice pack and washcloth on neck to ensure we keep nausea settled if able.    Dix-Hallpike Right Symptoms No nystagmus      Sidelying Right   Sidelying Right Duration moved slowly into sidelying with 3 pillows; noted a sense of spinning for 1-2 seconds and it stops    Sidelying Right Symptoms No nystagmus                     Vestibular Treatment/Exercise - 11/22/20 1545      Vestibular Treatment/Exercise   Vestibular Treatment Provided Habituation;Canalith Repositioning    Canalith Repositioning Epley Manuever Right    Habituation Exercises Nestor Lewandowsky       EPLEY MANUEVER RIGHT   Number of Reps  2    Overall Response Improved Symptoms    Response Details  with epley's with 1st rep, noted upbeating rotary nystagmus <5 seconds      Nestor Lewandowsky   Number of Reps  4    Symptom Description  began with 3 pillows, then 2  pillows x 2 reps, and then down to 1 pillow.  She feels more dizziness when down to 1 pillow for head position.   Held ice pack during habituation to keep nausea down                      PT Long Term Goals - 11/15/20 1754      PT LONG TERM GOAL #1   Title The patient will be independent with HEP for habituation.    Time 6    Period Weeks    Target Date 12/27/20      PT LONG TERM GOAL #2   Title The patient will have negative R dix hallpike testing indicating resolution of BPPV.    Time 6    Period Weeks    Target Date 12/27/20      PT LONG TERM GOAL #3   Title The patient will report no sensation of spinning with bed mobility.    Time 6    Period Weeks    Target Date 12/27/20                 Plan - 11/22/20 1606    Clinical Impression Statement The patient has mild (short duration, low intensity) nystagmus noted with R sidelying and when initiating epley's maneuver.  Patient gets mild nausea today and we used cold packs and rest to allow symptoms to settle.  She is no longer using meclizine for vertigo and feels significant improvement.  PT educated on way to do HEP with pillows since she only tried once and felt spinning and avoided further HEP.  PT to keep working ot [place].    PT Treatment/Interventions ADLs/Self Care Home Management;Canalith Repostioning;Vestibular;Neuromuscular re-education;Manual techniques;Patient/family education    PT Next Visit Plan Reassess R BPPV, ensure other canals cleared as patient tolerates    PT Home Exercise Plan Access Code: [email or web address]    Consulted and Agree with Plan of Care Patient           Patient will benefit from skilled therapeutic intervention in order to improve the following deficits and impairments:     Visit Diagnosis: BPPV (benign paroxysmal positional vertigo), right  Dizziness and giddiness     Problem List Patient Active Problem List   Diagnosis Date Noted  . BPPV (benign paroxysmal positional  vertigo), right 11/13/2020  . Orthostatic hypotension  07/09/2020  . Pre-diabetes 07/08/2020  . Vertigo 07/08/2020  . Vitamin D deficiency 07/08/2020  . Moderate episode of recurrent major depressive disorder ([place]) 12/01/2019  . Stress 10/27/2019  . No energy 03/21/2018  . Class 2 obesity due to excess calories without serious comorbidity with body mass index (BMI) of 38.0 to 38.9 in adult 03/15/2018  . Anxiety 11/23/2017  . Essential hypertension 05/18/2017  . Elevated fasting glucose 05/18/2017  . Tobacco abuse 05/04/2017  . Left breast mass 04/19/2017  . Swelling of foot joint, left 01/10/2017  . Cerumen impaction 10/23/2014  . History of diverticulitis 05/25/2014  . Calculus of gallbladder 05/25/2014  . GERD (gastroesophageal reflux disease) 05/23/2014  . Heart attack ([place]) 05/23/2014  . CAD (coronary artery disease) 05/23/2014  . Depression 05/23/2014  . Diverticulitis 05/23/2014    [place], [place] 11/22/2020, 4:08 PM  [facility] [place], [place], [address] Phone: [phone]   Fax:  [phone]  Name: Nina Taylor MRN: [id number] Date of Birth: 04-20-1956

## 2020-11-26 ENCOUNTER — Other Ambulatory Visit: Payer: Self-pay | Admitting: Physician Assistant

## 2020-11-26 DIAGNOSIS — N6320 Unspecified lump in the left breast, unspecified quadrant: Secondary | ICD-10-CM

## 2020-11-30 ENCOUNTER — Other Ambulatory Visit: Payer: Self-pay | Admitting: Physician Assistant

## 2020-11-30 DIAGNOSIS — F419 Anxiety disorder, unspecified: Secondary | ICD-10-CM

## 2020-11-30 DIAGNOSIS — F331 Major depressive disorder, recurrent, moderate: Secondary | ICD-10-CM

## 2020-12-05 ENCOUNTER — Telehealth: Payer: Self-pay

## 2020-12-05 ENCOUNTER — Encounter: Payer: Self-pay | Admitting: Neurology

## 2020-12-05 NOTE — Telephone Encounter (Signed)
PA appeal sent for buspirone 7.5.

## 2020-12-05 NOTE — Telephone Encounter (Signed)
This encounter was created in error - please disregard.

## 2020-12-26 NOTE — Telephone Encounter (Signed)
Finished questions received from Cover[employer]eds on medication.   Your information has been submitted to [employer]. [employer] is reviewing the PA request and you will receive an electronic response. You may check for the updated outcome later by reopening this request. The standard fax determination will also be sent to you directly.  If you have any questions about your PA submission, contact [employer] at [phone].

## 2020-12-31 NOTE — Telephone Encounter (Signed)
Received denial for medication. Patient made aware via mychart.

## 2021-02-28 ENCOUNTER — Other Ambulatory Visit: Payer: Self-pay | Admitting: Physician Assistant

## 2021-02-28 DIAGNOSIS — F419 Anxiety disorder, unspecified: Secondary | ICD-10-CM

## 2021-02-28 DIAGNOSIS — F32A Depression, unspecified: Secondary | ICD-10-CM

## 2021-03-04 ENCOUNTER — Other Ambulatory Visit: Payer: Self-pay | Admitting: Physician Assistant

## 2021-03-04 DIAGNOSIS — F331 Major depressive disorder, recurrent, moderate: Secondary | ICD-10-CM

## 2021-03-04 DIAGNOSIS — F419 Anxiety disorder, unspecified: Secondary | ICD-10-CM

## 2021-05-02 ENCOUNTER — Other Ambulatory Visit: Payer: Self-pay | Admitting: Physician Assistant

## 2021-05-02 DIAGNOSIS — F32A Depression, unspecified: Secondary | ICD-10-CM

## 2021-05-26 ENCOUNTER — Encounter: Payer: BLUE CROSS/BLUE SHIELD | Admitting: Physician Assistant

## 2021-06-10 ENCOUNTER — Ambulatory Visit (INDEPENDENT_AMBULATORY_CARE_PROVIDER_SITE_OTHER): Payer: Managed Care, Other (non HMO) | Admitting: Physician Assistant

## 2021-06-10 ENCOUNTER — Encounter: Payer: Self-pay | Admitting: Physician Assistant

## 2021-06-10 ENCOUNTER — Other Ambulatory Visit: Payer: Self-pay | Admitting: Physician Assistant

## 2021-06-10 VITALS — BP 114/62 | HR 87 | Ht 66.0 in | Wt 239.0 lb

## 2021-06-10 DIAGNOSIS — R7303 Prediabetes: Secondary | ICD-10-CM

## 2021-06-10 DIAGNOSIS — Z23 Encounter for immunization: Secondary | ICD-10-CM | POA: Diagnosis not present

## 2021-06-10 DIAGNOSIS — I10 Essential (primary) hypertension: Secondary | ICD-10-CM | POA: Diagnosis not present

## 2021-06-10 DIAGNOSIS — F32A Depression, unspecified: Secondary | ICD-10-CM

## 2021-06-10 DIAGNOSIS — Z0000 Encounter for general adult medical examination without abnormal findings: Secondary | ICD-10-CM

## 2021-06-10 DIAGNOSIS — K2100 Gastro-esophageal reflux disease with esophagitis, without bleeding: Secondary | ICD-10-CM

## 2021-06-10 DIAGNOSIS — Z1231 Encounter for screening mammogram for malignant neoplasm of breast: Secondary | ICD-10-CM

## 2021-06-10 DIAGNOSIS — Z6838 Body mass index (BMI) 38.0-38.9, adult: Secondary | ICD-10-CM

## 2021-06-10 DIAGNOSIS — Z13820 Encounter for screening for osteoporosis: Secondary | ICD-10-CM

## 2021-06-10 DIAGNOSIS — F419 Anxiety disorder, unspecified: Secondary | ICD-10-CM

## 2021-06-10 DIAGNOSIS — E6609 Other obesity due to excess calories: Secondary | ICD-10-CM

## 2021-06-10 DIAGNOSIS — J209 Acute bronchitis, unspecified: Secondary | ICD-10-CM

## 2021-06-10 DIAGNOSIS — F331 Major depressive disorder, recurrent, moderate: Secondary | ICD-10-CM

## 2021-06-10 MED ORDER — DEXLANSOPRAZOLE 30 MG PO CPDR: 30.0000 mg | DELAYED_RELEASE_CAPSULE | Freq: Every day | ORAL | 11 refills | Status: AC

## 2021-06-10 MED ORDER — ALBUTEROL SULFATE HFA 108 (90 BASE) MCG/ACT IN AERS: 2.0000 | INHALATION_SPRAY | Freq: Four times a day (QID) | RESPIRATORY_TRACT | 0 refills | Status: DC | PRN

## 2021-06-10 MED ORDER — VILAZODONE HCL 40 MG PO TABS: ORAL_TABLET | ORAL | 3 refills | Status: AC

## 2021-06-10 MED ORDER — LORAZEPAM 0.5 MG PO TABS: 0.5000 mg | ORAL_TABLET | Freq: Three times a day (TID) | ORAL | 1 refills | Status: DC | PRN

## 2021-06-10 NOTE — Patient Instructions (Signed)

## 2021-06-11 LAB — COMPLETE METABOLIC PANEL WITH GFR
AG Ratio: 1.4 (calc) (ref 1.0–2.5)
ALT: 23 U/L (ref 6–29)
AST: 19 U/L (ref 10–35)
Albumin: 4.6 g/dL (ref 3.6–5.1)
Alkaline phosphatase (APISO): 93 U/L (ref 37–153)
BUN: 17 mg/dL (ref 7–25)
CO2: 24 mmol/L (ref 20–32)
Calcium: 9.7 mg/dL (ref 8.6–10.4)
Chloride: 107 mmol/L (ref 98–110)
Creat: 0.73 mg/dL (ref 0.50–1.05)
Globulin: 3.4 g/dL (calc) (ref 1.9–3.7)
Glucose, Bld: 103 mg/dL — ABNORMAL HIGH (ref 65–99)
Potassium: 5.2 mmol/L (ref 3.5–5.3)
Sodium: 140 mmol/L (ref 135–146)
Total Bilirubin: 0.3 mg/dL (ref 0.2–1.2)
Total Protein: 8 g/dL (ref 6.1–8.1)
eGFR: 91 mL/min/{1.73_m2} (ref >=60)

## 2021-06-11 LAB — HEMOGLOBIN A1C
Hgb A1c MFr Bld: 6.2 % of total Hgb — ABNORMAL HIGH (ref <5.7)
Mean Plasma Glucose: 131 mg/dL
eAG (mmol/L): 7.3 mmol/L

## 2021-06-11 NOTE — Progress Notes (Signed)
Viv,   Your A1C is 6.2 and stable in the pre-diabetes range. Recheck in 6 months. Continue to work on low sugar/carb/processed food diet. Try to do 30 minutes of cardio 5 days a week.  Kidney and liver look good.

## 2021-06-13 ENCOUNTER — Other Ambulatory Visit: Payer: Self-pay | Admitting: Neurology

## 2021-06-13 DIAGNOSIS — J209 Acute bronchitis, unspecified: Secondary | ICD-10-CM

## 2021-06-13 MED ORDER — ALBUTEROL SULFATE HFA 108 (90 BASE) MCG/ACT IN AERS: 2.0000 | INHALATION_SPRAY | Freq: Four times a day (QID) | RESPIRATORY_TRACT | 0 refills | Status: AC | PRN

## 2021-06-14 ENCOUNTER — Encounter: Payer: Self-pay | Admitting: Physician Assistant

## 2021-06-14 NOTE — Progress Notes (Signed)
Subjective:     Nina Taylor is a 65 y.o. female and is here for a comprehensive physical exam. The patient reports problems - pt is frustrated with her weight. She lost a lot of weight doing the next 56 days but has since gained it all back .  Social History   Socioeconomic History   Marital status: Legally Separated    Spouse name: Not on file   Number of children: Not on file   Years of education: Not on file   Highest education level: Not on file  Occupational History   Not on file  Tobacco Use   Smoking status: Every Day    Packs/day: 0.25    Years: 20.00    Pack years: 5.00    Types: Cigarettes    Start date: 09/10/1993   Smokeless tobacco: Never  Vaping Use   Vaping Use: Never used  Substance and Sexual Activity   Alcohol use: Yes    Alcohol/week: 0.0 standard drinks    Comment: wine rarely   Drug use: No   Sexual activity: Not Currently  Other Topics Concern   Not on file  Social History Narrative   Not on file   Social Determinants of Health   Financial Resource Strain: Not on file  Food Insecurity: Not on file  Transportation Needs: Not on file  Physical Activity: Not on file  Stress: Not on file  Social Connections: Not on file  Intimate Partner Violence: Not on file   Health Maintenance  Topic Date Due   HIV Screening  Never done   Zoster Vaccines- Shingrix (1 of 2) Never done   MAMMOGRAM  04/02/2018   DEXA SCAN  Never done   COVID-19 Vaccine (4 - Booster for Janssen series) 06/26/2021 (Originally 05/09/2021)   COLONOSCOPY (Pts 45-58yrs Insurance coverage will need to be confirmed)  09/14/2022   TETANUS/TDAP  01/09/2027   INFLUENZA VACCINE  Completed   Hepatitis C Screening  Completed   HPV VACCINES  Aged Out   PAP SMEAR-Modifier  Discontinued    The following portions of the patient's history were reviewed and updated as appropriate: allergies, current medications, past family history, past medical history, past social history, past surgical  history, and problem list.  Review of Systems Pertinent items noted in HPI and remainder of comprehensive ROS otherwise negative.   Objective:    BP 114/62   Pulse 87   Ht 5\' 6"  (1.676 m)   Wt 239 lb (108.4 kg)   SpO2 97%   BMI 38.58 kg/m  General appearance: alert, cooperative, appears stated age, and moderately obese Head: Normocephalic, without obvious abnormality, atraumatic Eyes: conjunctivae/corneas clear. PERRL, EOM's intact. Fundi benign. Ears: normal TM's and external ear canals both ears Nose: Nares normal. Septum midline. Mucosa normal. No drainage or sinus tenderness. Throat: lips, mucosa, and tongue normal; teeth and gums normal Neck: no adenopathy, no carotid bruit, no JVD, supple, symmetrical, trachea midline, and thyroid not enlarged, symmetric, no tenderness/mass/nodules Back: symmetric, no curvature. ROM normal. No CVA tenderness. Lungs: clear to auscultation bilaterally Heart: regular rate and rhythm, S1, S2 normal, no murmur, click, rub or gallop Abdomen: soft, non-tender; bowel sounds normal; no masses,  no organomegaly Extremities: extremities normal, atraumatic, no cyanosis or edema Pulses: 2+ and symmetric Skin: Skin color, texture, turgor normal. No rashes or lesions Lymph nodes: Cervical, supraclavicular, and axillary nodes normal. Neurologic: Alert and oriented X 3, normal strength and tone. Normal symmetric reflexes. Normal coordination and gait   .Marland Kitchen  Depression screen [facility] 2/9 06/10/2021 11/13/2020 06/17/2020 12/01/2019 03/22/2019  Decreased Interest 1 3 2 2 1   Down, Depressed, Hopeless 0 3 3 2  0  PHQ - 2 Score 1 6 5 4 1   Altered sleeping 1 3 1 2  0  Tired, decreased energy 1 2 1 2  0  Change in appetite 2 1 2 2 3   Feeling bad or failure about yourself  0 3 2 2  0  Trouble concentrating 1 3 2 3 3   Moving slowly or fidgety/restless 1 2 0 3 3  Suicidal thoughts 0 0 0 0 0  PHQ-9 Score 7 20 13 18 10   Difficult doing work/chores Somewhat difficult Extremely  dIfficult Not difficult at all Very difficult Very difficult  Some recent data might be hidden   .Marland Kitchen GAD 7 : Generalized Anxiety Score 06/10/2021 11/13/2020 12/01/2019 03/22/2019  Nervous, Anxious, on Edge 1 3 1 3   Control/stop worrying 2 3 2 3   Worry too much - different things 2 3 2 3   Trouble relaxing 1 3 3 3   Restless 2 2 3 3   Easily annoyed or irritable 0 2 3 2   Afraid - awful might happen 1 2 1 2   Total GAD 7 Score 9 18 15 19   Anxiety Difficulty Not difficult at all Extremely difficult Very difficult Somewhat difficult     Assessment:    Healthy female exam.     Plan:  Marland KitchenMarland KitchenChieko was seen today for annual exam.  Diagnoses and all orders for this visit:  Routine physical examination  Essential hypertension -     COMPLETE METABOLIC PANEL WITH GFR  Pre-diabetes -     Hemoglobin A1c  Needs flu shot -     Cancel: Flu Vaccine QUAD 22mo+IM (Fluarix, Fluzone & Alfiuria Quad PF) -     Flu Vaccine QUAD High Dose(Fluad)  Anxiety -     Vilazodone HCl (VIIBRYD) 40 MG TABS; Take 1 tablet by mouth once daily. -     LORazepam (ATIVAN) 0.5 MG tablet; Take 1 tablet (0.5 mg total) by mouth every 8 (eight) hours as needed. for anxiety  Moderate episode of recurrent major depressive disorder (HCC) -     Vilazodone HCl (VIIBRYD) 40 MG TABS; Take 1 tablet by mouth once daily.  Visit for screening mammogram -     MM 3D SCREEN BREAST BILATERAL; Future  Osteoporosis screening -     DG Bone Density; Future  Acute bronchitis, unspecified organism -     Discontinue: albuterol (VENTOLIN HFA) 108 (90 Base) MCG/ACT inhaler; Inhale 2 puffs into the lungs every 6 (six) hours as needed for wheezing or shortness of breath.  Gastroesophageal reflux disease with esophagitis without hemorrhage -     Dexlansoprazole (DEXILANT) 30 MG capsule; Take 1 capsule (30 mg total) by mouth daily.  Class 2 obesity due to excess calories without serious comorbidity with body mass index (BMI) of 38.0 to 38.9 in  adult  .Marland Kitchen Discussed 150 minutes of exercise a week.  Encouraged vitamin D 1000 units and Calcium 1300mg  or 4 servings of dairy a day.  PHQ/GAD looks great. Refilled viibryd. Ativan only as needed.  Fasting labs ordered.  Mammogram ordered.  Bone density ordered.  Colonoscopy UTD.  Covid vaccine x3.  Flu shot given today.  Discussed shingrix and pneumonia vaccine. Will consider.   GERD controlled with dexilant.   Marland Kitchen.Discussed low carb diet with 1500 calories and 80g of protein.  Exercising at least 150 minutes a week.  [employer]  could be a Microbiologist.  Discussed plenity.      See After Visit Summary for Counseling Recommendations

## 2021-06-24 ENCOUNTER — Telehealth: Payer: Self-pay

## 2021-06-24 NOTE — Telephone Encounter (Signed)
Medication: albuterol (VENTOLIN HFA) 108 (90 Base) MCG/ACT inhaler Prior authorization submitted via CoverMyMeds on 06/24/2021 PA submission pending

## 2021-07-07 ENCOUNTER — Telehealth: Payer: Self-pay

## 2021-07-07 NOTE — Telephone Encounter (Signed)
Medication: Dexlansoprazole (DEXILANT) 30 MG capsule Prior authorization submitted via CoverMyMeds on 07/07/2021 PA submission pending

## 2021-09-30 ENCOUNTER — Encounter: Payer: Self-pay | Admitting: Physician Assistant

## 2021-09-30 DIAGNOSIS — F419 Anxiety disorder, unspecified: Secondary | ICD-10-CM

## 2021-09-30 DIAGNOSIS — F32A Depression, unspecified: Secondary | ICD-10-CM

## 2021-09-30 MED ORDER — LORAZEPAM 0.5 MG PO TABS: 0.5000 mg | ORAL_TABLET | Freq: Three times a day (TID) | ORAL | 1 refills | Status: AC | PRN

## 2021-09-30 MED ORDER — BUSPIRONE HCL 7.5 MG PO TABS: 7.5000 mg | ORAL_TABLET | Freq: Two times a day (BID) | ORAL | 1 refills | Status: AC

## 2022-04-15 ENCOUNTER — Encounter (INDEPENDENT_AMBULATORY_CARE_PROVIDER_SITE_OTHER): Payer: Self-pay

## 2022-10-30 ENCOUNTER — Encounter: Payer: Self-pay | Admitting: Gastroenterology
